# Patient Record
Sex: Female | Born: 1960 | Race: White | Hispanic: No | Marital: Married | Smoking: Never smoker
Health system: Southern US, Community
[De-identification: ages and names within clinical notes are randomized; demographics above are authoritative.]

## PROBLEM LIST (undated history)

## (undated) DIAGNOSIS — K638219 Small intestinal bacterial overgrowth, unspecified: Secondary | ICD-10-CM

## (undated) DIAGNOSIS — Z9109 Other allergy status, other than to drugs and biological substances: Secondary | ICD-10-CM

## (undated) DIAGNOSIS — R011 Cardiac murmur, unspecified: Secondary | ICD-10-CM

## (undated) DIAGNOSIS — M7989 Other specified soft tissue disorders: Secondary | ICD-10-CM

## (undated) DIAGNOSIS — M810 Age-related osteoporosis without current pathological fracture: Secondary | ICD-10-CM

## (undated) DIAGNOSIS — F32A Depression, unspecified: Secondary | ICD-10-CM

## (undated) DIAGNOSIS — K56609 Unspecified intestinal obstruction, unspecified as to partial versus complete obstruction: Secondary | ICD-10-CM

## (undated) DIAGNOSIS — K259 Gastric ulcer, unspecified as acute or chronic, without hemorrhage or perforation: Secondary | ICD-10-CM

## (undated) DIAGNOSIS — K6389 Other specified diseases of intestine: Secondary | ICD-10-CM

## (undated) DIAGNOSIS — G43909 Migraine, unspecified, not intractable, without status migrainosus: Secondary | ICD-10-CM

## (undated) DIAGNOSIS — R12 Heartburn: Secondary | ICD-10-CM

## (undated) DIAGNOSIS — G8929 Other chronic pain: Secondary | ICD-10-CM

## (undated) DIAGNOSIS — J45909 Unspecified asthma, uncomplicated: Secondary | ICD-10-CM

## (undated) DIAGNOSIS — F419 Anxiety disorder, unspecified: Secondary | ICD-10-CM

## (undated) DIAGNOSIS — K760 Fatty (change of) liver, not elsewhere classified: Secondary | ICD-10-CM

## (undated) DIAGNOSIS — I10 Essential (primary) hypertension: Secondary | ICD-10-CM

## (undated) DIAGNOSIS — F329 Major depressive disorder, single episode, unspecified: Secondary | ICD-10-CM

## (undated) DIAGNOSIS — K5900 Constipation, unspecified: Secondary | ICD-10-CM

## (undated) DIAGNOSIS — M5030 Other cervical disc degeneration, unspecified cervical region: Secondary | ICD-10-CM

## (undated) DIAGNOSIS — K219 Gastro-esophageal reflux disease without esophagitis: Secondary | ICD-10-CM

## (undated) DIAGNOSIS — M542 Cervicalgia: Secondary | ICD-10-CM

## (undated) DIAGNOSIS — F4310 Post-traumatic stress disorder, unspecified: Secondary | ICD-10-CM

## (undated) DIAGNOSIS — G4730 Sleep apnea, unspecified: Secondary | ICD-10-CM

## (undated) DIAGNOSIS — R0602 Shortness of breath: Secondary | ICD-10-CM

## (undated) HISTORY — DX: Heartburn: R12

## (undated) HISTORY — PX: MANDIBLE FRACTURE SURGERY: SHX706

## (undated) HISTORY — PX: DIAGNOSTIC LAPAROSCOPY: SUR761

## (undated) HISTORY — DX: Sleep apnea, unspecified: G47.30

## (undated) HISTORY — DX: Cardiac murmur, unspecified: R01.1

## (undated) HISTORY — DX: Other specified soft tissue disorders: M79.89

## (undated) HISTORY — PX: HAND SURGERY: SHX662

## (undated) HISTORY — PX: LUMBAR MICRODISCECTOMY: SHX99

## (undated) HISTORY — DX: Cervicalgia: M54.2

## (undated) HISTORY — DX: Age-related osteoporosis without current pathological fracture: M81.0

## (undated) HISTORY — PX: BACK SURGERY: SHX140

## (undated) HISTORY — DX: Fatty (change of) liver, not elsewhere classified: K76.0

## (undated) HISTORY — DX: Essential (primary) hypertension: I10

## (undated) HISTORY — DX: Anxiety disorder, unspecified: F41.9

## (undated) HISTORY — DX: Unspecified intestinal obstruction, unspecified as to partial versus complete obstruction: K56.609

## (undated) HISTORY — DX: Other chronic pain: G89.29

## (undated) HISTORY — DX: Gastric ulcer, unspecified as acute or chronic, without hemorrhage or perforation: K25.9

## (undated) HISTORY — DX: Small intestinal bacterial overgrowth, unspecified: K63.8219

## (undated) HISTORY — DX: Constipation, unspecified: K59.00

## (undated) HISTORY — DX: Depression, unspecified: F32.A

## (undated) HISTORY — DX: Shortness of breath: R06.02

## (undated) HISTORY — DX: Unspecified asthma, uncomplicated: J45.909

## (undated) HISTORY — DX: Gastro-esophageal reflux disease without esophagitis: K21.9

## (undated) HISTORY — PX: DILATION AND CURETTAGE OF UTERUS: SHX78

## (undated) HISTORY — PX: APPENDECTOMY: SHX54

## (undated) HISTORY — DX: Other allergy status, other than to drugs and biological substances: Z91.09

## (undated) HISTORY — DX: Migraine, unspecified, not intractable, without status migrainosus: G43.909

## (undated) HISTORY — DX: Other specified diseases of intestine: K63.89

## (undated) HISTORY — DX: Other cervical disc degeneration, unspecified cervical region: M50.30

## (undated) HISTORY — DX: Major depressive disorder, single episode, unspecified: F32.9

## (undated) HISTORY — DX: Post-traumatic stress disorder, unspecified: F43.10

---
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
## 2006-03-26 ENCOUNTER — Ambulatory Visit: Payer: Self-pay | Admitting: Family Medicine

## 2006-06-22 ENCOUNTER — Ambulatory Visit: Payer: Self-pay | Admitting: Family Medicine

## 2006-06-25 ENCOUNTER — Ambulatory Visit: Payer: Self-pay | Admitting: Internal Medicine

## 2007-06-09 HISTORY — PX: OTHER SURGICAL HISTORY: SHX169

## 2007-09-06 ENCOUNTER — Emergency Department: Payer: Self-pay | Admitting: Emergency Medicine

## 2007-09-19 ENCOUNTER — Ambulatory Visit: Payer: Self-pay | Admitting: Family Medicine

## 2007-10-05 ENCOUNTER — Ambulatory Visit: Payer: Self-pay | Admitting: Urology

## 2007-11-29 ENCOUNTER — Ambulatory Visit: Payer: Self-pay | Admitting: Internal Medicine

## 2007-12-03 ENCOUNTER — Ambulatory Visit (HOSPITAL_COMMUNITY): Admission: RE | Admit: 2007-12-03 | Discharge: 2007-12-04 | Payer: Self-pay | Admitting: Neurosurgery

## 2008-04-26 ENCOUNTER — Ambulatory Visit: Payer: Self-pay

## 2008-04-27 ENCOUNTER — Ambulatory Visit: Payer: Self-pay

## 2008-05-01 ENCOUNTER — Ambulatory Visit: Payer: Self-pay

## 2008-05-25 ENCOUNTER — Ambulatory Visit: Payer: Self-pay | Admitting: Urology

## 2008-07-27 ENCOUNTER — Ambulatory Visit: Payer: Self-pay | Admitting: Urology

## 2010-01-14 ENCOUNTER — Ambulatory Visit: Payer: Self-pay | Admitting: Internal Medicine

## 2010-07-08 ENCOUNTER — Other Ambulatory Visit: Payer: Self-pay | Admitting: General Practice

## 2010-07-10 ENCOUNTER — Ambulatory Visit: Payer: Self-pay | Admitting: General Practice

## 2010-07-15 ENCOUNTER — Other Ambulatory Visit: Payer: Self-pay | Admitting: Emergency Medicine

## 2010-10-21 NOTE — Op Note (Signed)
NAME:  [name] [name], [name] [name]           ACCOUNT NO.:  [id number]   MEDICAL RECORD NO.:  [id number]          PATIENT TYPE:  OIB   LOCATION:  2550                         FACILITY:  [facility]   PHYSICIAN:  Hewitt Shorts, M.D.DATE OF BIRTH:  04/08/1961   DATE OF PROCEDURE:  12/03/2007  DATE OF DISCHARGE:                               OPERATIVE REPORT   PREOPERATIVE DIAGNOSES:  1. Left L5-S1 lumbar disk herniation.  2. Lumbar degenerative disk disease.  3. Lumbar spondylosis.  4. Lumbar radiculopathy.   POSTOPERATIVE DIAGNOSES:  1. Left L5-S1 lumbar disk herniation.  2. Lumbar degenerative disk disease.  3. Lumbar spondylosis.  4. Lumbar radiculopathy.   PROCEDURES:  Left L5-S1 lumbar laminotomy and microdiskectomy with  microdissection.   SURGEON:  Hewitt Shorts, M.D.   ASSISTANT:  Danae Orleans. Venetia Maxon, M.D.   ANESTHESIA:  General endotracheal.   INDICATIONS:  The patient is a 50 year old woman who presented with  disabling left lumbar radiculopathy who was found to have a large left  L5-S1 lumbar disk herniation with free fragment that has migrated  caudally down to the body of S1 and was wedged in the axilla of the left  S1 nerve root.  Decision was made to proceed with laminotomy and  microdiscectomy.   PROCEDURE:  The patient was brought to the operating room and placed  under general endotracheal anesthesia.  The patient was turned to a  prone position.  Lumbar region was prepped with Betadine soap and  solution and draped in a sterile fashion.  The midline was infiltrated  with local anesthetic with epinephrine and x-ray was taken.  The L5-S1  level was identified and then a midline incision was made over the L5-S1  level and carried down through the subcutaneous tissue.  Bipolar  electrocautery was used to maintain hemostasis.  Dissection was carried  down to the lumbar fascia, which was incised on the left side of the  midline.  The paraspinal muscles were dissected  from the spinous process  and lamina in a subperiosteal fashion.  A self-retaining retractor was  placed and an x-ray was taken and the L5-S1 intralaminar space was  identified.  The microscope was draped and brought to the field to  provide additional navigation, illumination, and visualization.  The  remainder of the decompression was performed using microdissection and  microsurgical technique.  A left L5-S1 laminotomy was performed using  the X-Max drill and Kerrison punches.  The ligamentum flavum was  carefully removed and we identified the thecal sac and left S1 nerve  root.  The S1 laminotomy was carried further caudally and we exposed the  fragment of disk wedged in the axilla of the left S1 nerve root.  This  was gently mobilized and we removed the first portion of fragment.  We  were then able to gently mobilized the S1 nerve root and thecal sac  mediately and a second larger fragment of disk herniation was then  mobilized and removed and we then further examined the epidural space,  we found one last smaller fragment and this was similarly removed.  We  then firmly examined  the epidural space nerve, further fragments were  present.  We examined the annulus and we rent in the annulus through  which these disk herniations that occurred appears to have closed over  and no opening into the annulus was identified.  Therefore, we elected  to not enter into the disk space and instead slowly just removed the  free fragment disk herniation.  Hemostasis was established with the use  of bipolar cautery and Gelfoam soaked with thrombin.  Gelfoam was  removed.  The wound was irrigated with Bacitracin solution.  Hemostasis  was confirmed and then we instilled 2 mL of fentanyl and 80 mg of Depo-  Medrol into the epidural space and then we proceeded with closure.  The  deep fascia was closed with interrupted undyed #1 Vicryl sutures.  Scarpa fascia was closed with interrupted undyed #1 Vicryl  sutures.  The  subcutaneous and subcuticular were closed with interrupted inverted 2-0  undyed Vicryl sutures.  The skin was closed with Dermabond.  The  procedure was tolerated well.  The estimated blood loss was less than 10  mL.  Sponge and needle count were correct.  Following surgery, the  patient was returned back to the supine position, to be reversed from  the anesthetic, extubated, and transferred to the recovery room for  further care.      Hewitt Shorts, M.D.  Electronically Signed     RWN/MEDQ  D:  12/03/2007  T:  12/03/2007  Job:  [id number]

## 2011-03-05 LAB — CBC
HCT: 41.9
Hemoglobin: 14.5
MCHC: 34.5
MCV: 92.7
Platelets: 401 — ABNORMAL HIGH
RBC: 4.52
RDW: 13.1
WBC: 12.2 — ABNORMAL HIGH

## 2011-03-05 LAB — BASIC METABOLIC PANEL
BUN: 11
CO2: 27
Calcium: 9.4
Chloride: 102
Creatinine, Ser: 0.72
GFR calc Af Amer: >60
GFR calc non Af Amer: >60
Glucose, Bld: 135 — ABNORMAL HIGH
Potassium: 4.1
Sodium: 139

## 2011-03-05 LAB — HCG, SERUM, QUALITATIVE: Preg, Serum: NEGATIVE

## 2011-06-09 HISTORY — PX: OTHER SURGICAL HISTORY: SHX169

## 2011-11-03 ENCOUNTER — Other Ambulatory Visit: Payer: Self-pay | Admitting: Podiatry

## 2011-11-05 ENCOUNTER — Encounter (HOSPITAL_BASED_OUTPATIENT_CLINIC_OR_DEPARTMENT_OTHER): Payer: Self-pay | Admitting: *Deleted

## 2011-11-05 NOTE — Progress Notes (Signed)
Pt was told h/p needed md sig-told me she went to a dr Coralie Carpen and called dr Welton Flakes to sign NP h/p They called me back-form not done there-not their NP-she has not been there since 11/12. LM with pt-where did she get this done- Called dr Laray Anger office,she will try to get her. 4pm-pt has not called me back or the office Dr Al Corpus wants to cancel her.

## 2011-11-05 NOTE — Progress Notes (Signed)
Pt had ekg and bmet at [facility] this week-to fax here H/p done by NP Called office to get md to sign  Pt aware

## 2011-11-06 ENCOUNTER — Encounter (HOSPITAL_BASED_OUTPATIENT_CLINIC_OR_DEPARTMENT_OTHER): Admission: RE | Payer: Self-pay | Source: Ambulatory Visit

## 2011-11-06 ENCOUNTER — Ambulatory Visit (HOSPITAL_BASED_OUTPATIENT_CLINIC_OR_DEPARTMENT_OTHER)
Admission: RE | Admit: 2011-11-06 | Payer: Private Health Insurance - Indemnity | Source: Ambulatory Visit | Admitting: Podiatry

## 2011-11-06 ENCOUNTER — Other Ambulatory Visit: Payer: Self-pay | Admitting: Podiatry

## 2011-11-06 HISTORY — DX: Essential (primary) hypertension: I10

## 2011-11-06 SURGERY — BUNIONECTOMY
Anesthesia: General

## 2011-11-11 NOTE — Progress Notes (Signed)
Pt has ov today with dr Welton Flakes for h/p-will fax May need new istat dos-did get labs last week before surg r/s

## 2011-11-13 ENCOUNTER — Ambulatory Visit (HOSPITAL_BASED_OUTPATIENT_CLINIC_OR_DEPARTMENT_OTHER): Payer: Private Health Insurance - Indemnity | Admitting: Anesthesiology

## 2011-11-13 ENCOUNTER — Encounter (HOSPITAL_BASED_OUTPATIENT_CLINIC_OR_DEPARTMENT_OTHER)
Admission: RE | Disposition: A | Discharge status: Home / Self Care | Payer: Self-pay | Source: Ambulatory Visit | Attending: Podiatry

## 2011-11-13 ENCOUNTER — Ambulatory Visit (HOSPITAL_BASED_OUTPATIENT_CLINIC_OR_DEPARTMENT_OTHER)
Admission: RE | Admit: 2011-11-13 | Discharge: 2011-11-13 | Disposition: A | Discharge status: Home / Self Care | Payer: Private Health Insurance - Indemnity | Source: Ambulatory Visit | Attending: Podiatry | Admitting: Podiatry

## 2011-11-13 ENCOUNTER — Encounter (HOSPITAL_BASED_OUTPATIENT_CLINIC_OR_DEPARTMENT_OTHER): Payer: Self-pay | Admitting: Anesthesiology

## 2011-11-13 ENCOUNTER — Encounter (HOSPITAL_BASED_OUTPATIENT_CLINIC_OR_DEPARTMENT_OTHER): Payer: Self-pay | Admitting: Certified Registered Nurse Anesthetist

## 2011-11-13 ENCOUNTER — Encounter (HOSPITAL_BASED_OUTPATIENT_CLINIC_OR_DEPARTMENT_OTHER): Payer: Self-pay | Admitting: Podiatry

## 2011-11-13 DIAGNOSIS — M2190 Unspecified acquired deformity of unspecified limb: Secondary | ICD-10-CM | POA: Insufficient documentation

## 2011-11-13 DIAGNOSIS — J45909 Unspecified asthma, uncomplicated: Secondary | ICD-10-CM | POA: Insufficient documentation

## 2011-11-13 DIAGNOSIS — I10 Essential (primary) hypertension: Secondary | ICD-10-CM | POA: Insufficient documentation

## 2011-11-13 DIAGNOSIS — M2010 Hallux valgus (acquired), unspecified foot: Secondary | ICD-10-CM | POA: Insufficient documentation

## 2011-11-13 DIAGNOSIS — M6240 Contracture of muscle, unspecified site: Secondary | ICD-10-CM | POA: Insufficient documentation

## 2011-11-13 HISTORY — PX: BUNIONECTOMY: SHX129

## 2011-11-13 HISTORY — PX: METATARSAL OSTEOTOMY: SHX1641

## 2011-11-13 SURGERY — BUNIONECTOMY
Anesthesia: General | Site: Foot | Laterality: Right | Wound class: Clean

## 2011-11-13 MED ORDER — ONDANSETRON HCL 4 MG/2ML IJ SOLN
INTRAMUSCULAR | Status: DC | PRN
  Administered 2011-11-13: 4 mg via INTRAVENOUS

## 2011-11-13 MED ORDER — CEPHALEXIN 500 MG PO CAPS: 500.0000 mg | ORAL_CAPSULE | Freq: Three times a day (TID) | ORAL | Status: AC

## 2011-11-13 MED ORDER — PROMETHAZINE HCL 12.5 MG PO TABS: 25.0000 mg | ORAL_TABLET | Freq: Four times a day (QID) | ORAL | Status: DC | PRN

## 2011-11-13 MED ORDER — FENTANYL CITRATE 0.05 MG/ML IJ SOLN
50.0000 ug | INTRAMUSCULAR | Status: DC | PRN
  Administered 2011-11-13: 100 ug via INTRAVENOUS

## 2011-11-13 MED ORDER — DEXAMETHASONE SODIUM PHOSPHATE 10 MG/ML IJ SOLN
INTRAMUSCULAR | Status: DC | PRN
  Administered 2011-11-13: 10 mg via INTRAVENOUS

## 2011-11-13 MED ORDER — SODIUM CHLORIDE 0.9 % IR SOLN
Status: DC | PRN
  Administered 2011-11-13: 15:00:00

## 2011-11-13 MED ORDER — LIDOCAINE HCL 1 % IJ SOLN
INTRAMUSCULAR | Status: DC | PRN
  Administered 2011-11-13: 2 mL via INTRADERMAL

## 2011-11-13 MED ORDER — PROPOFOL 10 MG/ML IV EMUL
INTRAVENOUS | Status: DC | PRN
  Administered 2011-11-13: 220 mg via INTRAVENOUS

## 2011-11-13 MED ORDER — EPHEDRINE SULFATE 50 MG/ML IJ SOLN
INTRAMUSCULAR | Status: DC | PRN
  Administered 2011-11-13 (×4): 10 mg via INTRAVENOUS

## 2011-11-13 MED ORDER — LIDOCAINE HCL 2 % IJ SOLN
INTRAMUSCULAR | Status: DC | PRN
  Administered 2011-11-13: 5 mL

## 2011-11-13 MED ORDER — MEPERIDINE HCL 50 MG PO TABS: ORAL_TABLET | ORAL | Status: AC

## 2011-11-13 MED ORDER — CEFAZOLIN SODIUM 1-5 GM-% IV SOLN: 1.0000 g | INTRAVENOUS | Status: DC

## 2011-11-13 MED ORDER — MIDAZOLAM HCL 2 MG/2ML IJ SOLN
0.5000 mg | INTRAMUSCULAR | Status: DC | PRN
  Administered 2011-11-13: 2 mg via INTRAVENOUS

## 2011-11-13 MED ORDER — MIDAZOLAM HCL 5 MG/5ML IJ SOLN
INTRAMUSCULAR | Status: DC | PRN
  Administered 2011-11-13: 1 mg via INTRAVENOUS

## 2011-11-13 MED ORDER — LIDOCAINE HCL (CARDIAC) 20 MG/ML IV SOLN
INTRAVENOUS | Status: DC | PRN
  Administered 2011-11-13: 30 mg via INTRAVENOUS

## 2011-11-13 MED ORDER — CHLORHEXIDINE GLUCONATE 4 % EX LIQD: 60.0000 mL | Freq: Once | CUTANEOUS | Status: DC

## 2011-11-13 MED ORDER — LACTATED RINGERS IV SOLN
INTRAVENOUS | Status: DC
  Administered 2011-11-13 (×2): via INTRAVENOUS

## 2011-11-13 MED ORDER — LIDOCAINE-EPINEPHRINE 1.5-1:200000 % IJ SOLN
INTRAMUSCULAR | Status: DC | PRN
  Administered 2011-11-13: 25 mL via INTRADERMAL

## 2011-11-13 MED ORDER — CEFAZOLIN SODIUM 1-5 GM-% IV SOLN
1.0000 g | INTRAVENOUS | Status: AC
  Administered 2011-11-13: 1 g via INTRAVENOUS

## 2011-11-13 MED ORDER — ROPIVACAINE HCL 5 MG/ML IJ SOLN
INTRAMUSCULAR | Status: DC | PRN
  Administered 2011-11-13: 25 mL via EPIDURAL

## 2011-11-13 SURGICAL SUPPLY — 66 items
BAG DECANTER FOR FLEXI CONT (MISCELLANEOUS) ×2 IMPLANT
BANDAGE CONFORM 2  STR LF (GAUZE/BANDAGES/DRESSINGS) ×2 IMPLANT
BANDAGE ELASTIC 3 VELCRO ST LF (GAUZE/BANDAGES/DRESSINGS) ×2 IMPLANT
BENZOIN TINCTURE PRP APPL 2/3 (GAUZE/BANDAGES/DRESSINGS) ×2 IMPLANT
BIT DRILL 100X2XQC STRL (BIT) ×1 IMPLANT
BIT DRILL 2.1 (BIT) ×1 IMPLANT
BIT DRILL QC 2.0X100 (BIT) ×1
BIT DRL 100X2XQC STRL (BIT) ×1
BLADE CCA MICRO SAG (BLADE) ×2 IMPLANT
BLADE MINI RND TIP GREEN BEAV (BLADE) IMPLANT
BLADE SURG 15 STRL LF DISP TIS (BLADE) ×2 IMPLANT
BLADE SURG 15 STRL SS (BLADE) ×2
BNDG COHESIVE 1X5 TAN STRL LF (GAUZE/BANDAGES/DRESSINGS) IMPLANT
BNDG COHESIVE 3X5 TAN STRL LF (GAUZE/BANDAGES/DRESSINGS) ×2 IMPLANT
BNDG ESMARK 4X9 LF (GAUZE/BANDAGES/DRESSINGS) ×2 IMPLANT
COVER MAYO STAND STRL (DRAPES) IMPLANT
COVER TABLE BACK 60X90 (DRAPES) ×2 IMPLANT
DECANTER SPIKE VIAL GLASS SM (MISCELLANEOUS) ×4 IMPLANT
DRAPE EXTREMITY T 121X128X90 (DRAPE) ×2 IMPLANT
DRAPE OEC MINIVIEW 54X84 (DRAPES) ×2 IMPLANT
DRAPE U 20/CS (DRAPES) ×2 IMPLANT
DRILL BIT 2.1 (BIT) ×1
DRILL BIT WIRE PASS (BIT) IMPLANT
DRILL WIRE PASS MED (BIT) IMPLANT
DURAPREP 26ML APPLICATOR (WOUND CARE) ×2 IMPLANT
ELECT REM PT RETURN 9FT ADLT (ELECTROSURGICAL) ×2
ELECTRODE REM PT RTRN 9FT ADLT (ELECTROSURGICAL) ×1 IMPLANT
GAUZE SPONGE 4X4 16PLY XRAY LF (GAUZE/BANDAGES/DRESSINGS) ×2 IMPLANT
GAUZE XEROFORM 1X8 LF (GAUZE/BANDAGES/DRESSINGS) ×2 IMPLANT
GLOVE BIO SURGEON STRL SZ 6.5 (GLOVE) ×4 IMPLANT
GLOVE BIO SURGEON STRL SZ7.5 (GLOVE) ×4 IMPLANT
GLOVE BIOGEL PI IND STRL 7.5 (GLOVE) ×1 IMPLANT
GLOVE BIOGEL PI INDICATOR 7.5 (GLOVE) ×1
GLOVE ECLIPSE 6.5 STRL STRAW (GLOVE) ×2 IMPLANT
GLOVE INDICATOR 7.0 STRL GRN (GLOVE) ×2 IMPLANT
GOWN PREVENTION PLUS XLARGE (GOWN DISPOSABLE) ×8 IMPLANT
GOWN PREVENTION PLUS XXLARGE (GOWN DISPOSABLE) IMPLANT
K-WIRE 1.2X100 (WIRE) ×2
KWIRE 1.2X100 (WIRE) ×1 IMPLANT
KWIRE 4.0 X .045IN (WIRE) IMPLANT
NDL SAFETY ECLIPSE 18X1.5 (NEEDLE) ×1 IMPLANT
NEEDLE HYPO 18GX1.5 SHARP (NEEDLE) ×1
NEEDLE HYPO 25X1 1.5 SAFETY (NEEDLE) ×4 IMPLANT
PACK BASIN DAY SURGERY FS (CUSTOM PROCEDURE TRAY) ×2 IMPLANT
PADDING CAST ABS 4INX4YD NS (CAST SUPPLIES) ×1
PADDING CAST ABS COTTON 4X4 ST (CAST SUPPLIES) ×1 IMPLANT
PENCIL BUTTON HOLSTER BLD 10FT (ELECTRODE) ×2 IMPLANT
SCREW CANN 3.0X16MM (Screw) ×2 IMPLANT
SCREW CORTEX ST 2.0X12 (Screw) ×2 IMPLANT
SPONGE GAUZE 2X2 8PLY STRL LF (GAUZE/BANDAGES/DRESSINGS) ×4 IMPLANT
SPONGE GAUZE 4X4 12PLY (GAUZE/BANDAGES/DRESSINGS) ×2 IMPLANT
STOCKINETTE 4X48 STRL (DRAPES) ×2 IMPLANT
STOCKINETTE TUBULAR COTT 4X25 (GAUZE/BANDAGES/DRESSINGS) ×4 IMPLANT
SUT MNCRL AB 4-0 PS2 18 (SUTURE) ×2 IMPLANT
SUT MON AB 3-0 SH 27 (SUTURE) ×1
SUT MON AB 3-0 SH27 (SUTURE) ×1 IMPLANT
SUT MON AB 5-0 P3 18 (SUTURE) ×2 IMPLANT
SUT MON AB 5-0 PS2 18 (SUTURE) ×2 IMPLANT
SUT PROLENE 4 0 P 3 18 (SUTURE) ×2 IMPLANT
SUT STEEL 2 0 (SUTURE) IMPLANT
SUT STRIPS 1/4 X 4 INCH (SUTURE) IMPLANT
SYR 3ML 18GX1 1/2 (SYRINGE) ×2 IMPLANT
SYR BULB 3OZ (MISCELLANEOUS) ×2 IMPLANT
SYR CONTROL 10ML LL (SYRINGE) ×2 IMPLANT
UNDERPAD 30X30 INCONTINENT (UNDERPADS AND DIAPERS) ×2 IMPLANT
WATER STERILE IRR 1000ML POUR (IV SOLUTION) ×2 IMPLANT

## 2011-11-13 NOTE — Brief Op Note (Signed)
11/13/2011  3:55 PM  PODIATRY POST OPERATIVE NOTE  SURGEON: [place], Stanford Strauch Todd  ASSISTANT: Marlowe Aschoff  PRE-OP DIAGNOSIS: Hallux Valgus Rt., Plantarflexed second metatarsal right, hammer toe deformity second and third toes right foot.  POST-OP DIAGNOSIS: same  PROCEDURE: Austin-Youngswick osteotomy with screw right, second metatarsal osteotomy with screw right, digital flexor tendon release second and third toes right.  ANESTHESIA: General with local and popliteal block   HEMOSTASIS: Tourniquet   ESTIMATED BLOOD LOSS: minimal   MATERIALS: 3.0 stryker 15mm; 2.0 synthese 12mm  INJECTABLES: 10ml 2% lidocaine without epinephrine  COMPLICATIONS: none  PATHOLOGY: none  CONDITION: vital signs stable   [name] [name] 11/13/2011,3:56 PM

## 2011-11-13 NOTE — Progress Notes (Signed)
Assisted Dr. Gelene Mink with right, popliteal/saphenous block. Side rails up, monitors on throughout procedure. See vital signs in flow sheet. Tolerated Procedure well.

## 2011-11-13 NOTE — Discharge Instructions (Signed)
Post Anesthesia Home Care Instructions  Activity: Get plenty of rest for the remainder of the day. A responsible adult should stay with you for 24 hours following the procedure.  For the next 24 hours, DO NOT: -Drive a car -Advertising copywriter -Drink alcoholic beverages -Take any medication unless instructed by your physician -Make any legal decisions or sign important papers.  Meals: Start with liquid foods such as gelatin or soup. Progress to regular foods as tolerated. Avoid greasy, spicy, heavy foods. If nausea and/or vomiting occur, drink only clear liquids until the nausea and/or vomiting subsides. Call your physician if vomiting continues.  Special Instructions/Symptoms: Your throat may feel dry or sore from the anesthesia or the breathing tube placed in your throat during surgery. If this causes discomfort, gargle with warm salt water. The discomfort should disappear within 24 hours.     Regional Anesthesia Blocks  1. Numbness or the inability to move the "blocked" extremity may last from 3-48 hours after placement. The length of time depends on the medication injected and your individual response to the medication. If the numbness is not going away after 48 hours, call your surgeon.  2. The extremity that is blocked will need to be protected until the numbness is gone and the  Strength has returned. Because you cannot feel it, you will need to take extra care to avoid injury. Because it may be weak, you may have difficulty moving it or using it. You may not know what position it is in without looking at it while the block is in effect.  3. For blocks in the legs and feet, returning to weight bearing and walking needs to be done carefully. You will need to wait until the numbness is entirely gone and the strength has returned. You should be able to move your leg and foot normally before you try and bear weight or walk. You will need someone to be with you when you first try to  ensure you do not fall and possibly risk injury.  4. Bruising and tenderness at the needle site are common side effects and will resolve in a few days.  5. Persistent numbness or new problems with movement should be communicated to the surgeon or the [facility] [phone] [facility] [phone]).     1. If you are recuperating from surgery anywhere other than home, please be sure to leave [place] the number where you can be reached.  2. Go directly home and rest.  3. Keep the operated foot (or feet) elevated six inches above the hip when sitting or lying.  4. Support the elevated foot and leg with pillows. DO NOT PLACE PILLOWS UNDER THE KNEE.  5. DO NOT REMOVE or get your bandages WET, you run a higher risk of getting an infection.  6. Wear your surgical shoe at all times when you are up.  7. A limited amount of pain swelling may occur. The skin may take on a bruised appearance. This is no cause for alarm.  8.For slight pain and swelling, apply an ice pack directly over the bandage for 15 minutes only out of each hour. Continue until seen in the office. DO NOT apply any form of heat to the area.  9. Have prescription(s) filled immediately and take as directed.  10. Drink a lot of liquids, water and juice.  11. CALL THE OFFICE IMMEDIATELY IF A.Bleeding continues B.Pain increases and/or does not respond to medication C.Bandage or cast appears  too tight D. Any liquids (water, coffee, etc.) spilling on your bandages E. Tripping, falling or stubbing the surgical foot F. If your temperature goes above 101 G. If you have ANY questions at all  12. Please use the crutches or walker that you rented. Do NO put weight on the operated foot for 0 days  13. Special instructions: 0  14. Your next appointment is: Thursday November 19, 2011 at 2:45  YOU NOW CONTROL THE EFFORT OF YOUR RECOVERY: Adhering to these rules will offer the most complete results.

## 2011-11-13 NOTE — Anesthesia Preprocedure Evaluation (Signed)
Anesthesia Evaluation  Patient identified by MRN, date of birth, ID band Patient awake    Reviewed: Allergy & Precautions, H&P , NPO status , Patient's Chart, lab work & pertinent test results, reviewed documented beta blocker date and time   Airway Mallampati: II TM Distance: >3 FB Neck ROM: full    Dental   Pulmonary asthma ,          Cardiovascular hypertension, On Medications and On Home Beta Blockers     Neuro/Psych negative neurological ROS  negative psych ROS   GI/Hepatic negative GI ROS, Neg liver ROS,   Endo/Other  negative endocrine ROS  Renal/GU negative Renal ROS  negative genitourinary   Musculoskeletal   Abdominal   Peds  Hematology negative hematology ROS (+)   Anesthesia Other Findings See surgeon's H&P   Reproductive/Obstetrics negative OB ROS                           Anesthesia Physical Anesthesia Plan  ASA: II  Anesthesia Plan: General   Post-op Pain Management:    Induction: Intravenous  Airway Management Planned: LMA  Additional Equipment:   Intra-op Plan:   Post-operative Plan: Extubation in OR  Informed Consent: I have reviewed the patients History and Physical, chart, labs and discussed the procedure including the risks, benefits and alternatives for the proposed anesthesia with the patient or authorized representative who has indicated his/her understanding and acceptance.   Dental Advisory Given  Plan Discussed with: CRNA and Surgeon  Anesthesia Plan Comments:         Anesthesia Quick Evaluation

## 2011-11-13 NOTE — Transfer of Care (Signed)
Immediate Anesthesia Transfer of Care Note  Patient: [name] [name]  Procedure(s) Performed: Procedure(s) (LRB): BUNIONECTOMY (Right) METATARSAL OSTEOTOMY (Right)  Patient Location: PACU  Anesthesia Type: General and Regional  Level of Consciousness: awake, alert , oriented and patient cooperative  Airway & Oxygen Therapy: Patient Spontanous Breathing and Patient connected to face mask oxygen  Post-op Assessment: Report given to PACU RN and Post -op Vital signs reviewed and stable  Post vital signs: Reviewed and stable  Complications: No apparent anesthesia complications

## 2011-11-13 NOTE — Anesthesia Procedure Notes (Addendum)
Anesthesia Regional Block:  Popliteal block  Pre-Anesthetic Checklist: ,, timeout performed, Correct Patient, Correct Site, Correct Laterality, Correct Procedure, Correct Position, site marked, Risks and benefits discussed,  Surgical consent,  Pre-op evaluation,  At surgeon's request and post-op pain management  Laterality: Right  Prep: chloraprep       Needles:   Needle Type: Other   (Arrow Echogenic)   Needle Length: 9cm  Needle Gauge: 21    Additional Needles:  Procedures: ultrasound guided Popliteal block Narrative:  Start time: 11/13/2011 1:32 PM End time: 11/13/2011 1:41 PM Injection made incrementally with aspirations every 5 mL.  Performed by: Personally  Anesthesiologist: Aldona Lento, MD  Additional Notes: Ultrasound guidance used to: id relevant anatomy, confirm needle position, local anesthetic spread, avoidance of vascular puncture. Picture saved. No complications. Block performed personally by Janetta Hora. Gelene Mink, MD  .    Popliteal block Procedure Name: LMA Insertion Date/Time: 11/13/2011 2:43 PM Performed by: Zuri Bradway D Pre-anesthesia Checklist: Patient identified, Emergency Drugs available, Suction available and Patient being monitored Patient Re-evaluated:Patient Re-evaluated prior to inductionOxygen Delivery Method: Circle System Utilized Preoxygenation: Pre-oxygenation with 100% oxygen Intubation Type: IV induction Ventilation: Mask ventilation without difficulty LMA: LMA inserted LMA Size: 4.0 Number of attempts: 1 Placement Confirmation: positive ETCO2 Tube secured with: Tape Dental Injury: Teeth and Oropharynx as per pre-operative assessment

## 2011-11-13 NOTE — H&P (Signed)
  H&P reviewed today and placed in chart.

## 2011-11-14 NOTE — Anesthesia Postprocedure Evaluation (Signed)
Anesthesia Post Note  Patient: [name] [name]  Procedure(s) Performed: Procedure(s) (LRB): BUNIONECTOMY (Right) METATARSAL OSTEOTOMY (Right)  Anesthesia type: General  Patient location: PACU  Post pain: Pain level controlled  Post assessment: Patient's Cardiovascular Status Stable  Last Vitals:  Filed Vitals:   11/13/11 1700  BP: 139/78  Pulse:   Temp: 36.5 C  Resp: 20    Post vital signs: Reviewed and stable  Level of consciousness: alert  Complications: No apparent anesthesia complications

## 2011-11-16 NOTE — Op Note (Signed)
NAME:  [name] [name]           ACCOUNT NO.:  [id number]  MEDICAL RECORD NO.:  [id number]  LOCATION:                                 FACILITY:  PHYSICIAN:  Sundra Haddix T. Epimenio Schetter, D.P.M.        DATE OF BIRTH:  DATE OF PROCEDURE: DATE OF DISCHARGE:                              OPERATIVE REPORT   PREOPERATIVE DIAGNOSES: 1. Hallux abductovalgus deformity, right foot. 2. Plantarflexed second metatarsal, right foot. 3. Contracted flexor tender, second toe, right foot. 4. Contracted flexor tender, third toe, right foot.  POSTOPERATIVE DIAGNOSES: 1. Hallux abductovalgus deformity, right foot. 2. Plantarflexed second metatarsal, right foot. 3. Contracted flexor tender, second toe, right foot. 4. Contracted flexor tender, third toe, right foot.  PROCEDURES: 1. Austin-Youngswick osteotomy with a 3.0 x 15-mm cannulated Stryker     screw. 2. Second metatarsal capital osteotomy shortening in nature with a 2.0     x 12-mm Synthes screw. 3. Flexor tendon tenotomy at the PIPJ, second digit, right foot. 4. Flexor tendon tenotomy at the PIPJ, third digit, right foot.  SURGEON:  Feige Lowdermilk T. [place], [place]  FIRST ASSISTANT:  Marlowe Aschoff, DPM  DESCRIPTION FOR PROCEDURE:  [name] [name] was given a popliteal block to her right lower extremity in preop holding.  She was then taken to the operating room, vital signs were stable, alert and oriented x3, and placed supine on the operating room table.  After general anesthesia was induced, pneumatic calf tourniquet was placed about the right lower extremity after cleaning of Webril pad and contusion.  The right lower extremity was then prepped and draped in a normal sterile fashion.  Austin-Youngswick osteotomy with a 3.0 x 15-mm cannulated Stryker screw: An Esmarch bandage was utilized to exsanguinate the blood from the right lower extremity.  The pneumatic calf tourniquet was inflated to 250 mmHg.  Dorsal linear incision was made over the first  metatarsal phalangeal joint medial to the extensor hallucis longus tendon.  It was carried deep with combination with sharp and blunt dissection to the level of the first metatarsal phalangeal joint.  All neurovascular structures were carefully retracted.  All bleeders were bovied and were ligated.  Once to the level of the first metatarsal phalangeal joint, attention was directed to the lateral aspect where the adductor tendon was identified and released from its insertion of the fibular sesamoid and the proximal phalanx.  Portion of this tendon was resected and removed.  The belly of the adductor muscle was bovied.  We did release the fibular sesamoid and flapped down.  This area was copiously lavaged with normal sterile saline.  Attention was directed to the first metatarsal phalangeal joint and the periosteum, the periosteum was incised linearly and an L-capsulotomy was performed.  The periosteum was reflected with the [employer] elevator and the capsule was reflected sharply to reveal the head of the first metatarsal through the wound.  Utilizing a sagittal saw, we resected the hypertropic medial condyle to the head of that first metatarsal, right foot and then in the typical fashion, performed a Austin-Youngswick-type osteotomy with apex centrally located in the head of the metatarsal.  Once this was performed, we had a nice shortening and then we  translocated the head laterally.  Once the head was in good position, we aligned the sesamoid.  Once the sesamoid was aligned, we then placed a K-wire across the osteotomy site.  This K-wire was used to come from [employer] and was used to guide the 3.0 x 15-mm Stryker screw.  The screw was placed in typical lag fashion and was noted to be tight.  We resected the medial shelf and then recontoured the bone to a normal anatomical resembling making sure that we had good alignment.  We XiScaned the osteotomy.  The screw was in perfect position and  the osteotomy was complete.  The area was copiously lavaged with normal sterile saline once again.  Periosteal and capsular closure was made with a 3-0 Monocryl, 4-0 Monocryl for subcutaneous closure, 5-0 Monocryl in a subcuticular fashion for closure.  Attention was then directed to the second metatarsal phalangeal joint.  Second metatarsal capital osteotomy shortening in nature with a 2.0 x 12- mm Synthes screw:  Using a skin blade, a linear incision approximately 2.5 cm long was made over the second metatarsal phalangeal joint.  This incision was then carried deep with combination with sharp and blunt dissection.  All neurovascular structures were carefully retracted.  All bleeders were bovied and were ligated.  Once to the level of the extensor hood, I then incised the periosteum and the capsule reflecting the tendons laterally and bring the periosteum from its adhesion to the bone.  I then reflected the head of the second metatarsal through the wound.  At this point, we performed a Weil osteotomy from dorsal, distal to proximal plantar through and through with a sagittal saw.  This allowed for a translocation of the head of the second metatarsal proximally.  Using the Hopkins, we made sure that a parabola was normal. Once the capital fragment was fully owned, we then placed a 2.0 x 12-mm Synthes screw in typical lag fashion across the osteotomy site. Resecting the dorsal self of bone with a rongeur, I then recontoured it to a normal anatomical resembling with an oscillating rasp.  The area was copiously lavaged with normal sterile saline.  Closure was made with a 4-0 Monocryl for deep structures and 5-0 Monocryl for subcutaneous closure.  Attention was then directed to the plantar aspect of the PIPJ the second and third digit.  Flexor tendon tenotomy at the PIPJ, second digit, right foot:  A small transverse incision was made beneath the PIPJ (proximal intermetatarsal).  This  transverse incision was then taken deep to the level of the joint as well as the flexor tendon.  The toe was straightened pulling the tendon against the blade, this allowed for feathering of the tendon and allowed [place] to straighten the toe perfectly. The area was copiously lavaged.  I then closed it was a single simple suture, which was 4-0 Monocryl.  Flexor tendon tenotomy at the PIPJ, third digit, right foot:  A small transverse incision was made beneath the PIPJ (proximal intermetatarsal).  This transverse incision was then taken deep to the level of the joint as well as the flexor tendon.  The toe was straightened pulling the tendon against the blade, this allowed for feathering of the tendon and allowed [place] to straighten the toe perfectly. The area was copiously lavaged.  I then closed it was a single simple suture, which was 4-0 Monocryl.  The Xeroform gauze, Betadine solution, and dry sterile compressive dressing was applied to the all of the incision sites and  the pneumatic calf tourniquet to the right lower extremity was deflated.  Capillary refill time digits 1 through 5 of the left foot was noted to be immediate.  The patient left the operating room.  Vital signs were stable.  Alert and oriented x3 for PACU.  Her vital signs will be monitored for a period of time before being released to family members. Both oral and written home-going instructions as well as prescription for postop pain medication was dispensed.  A CAM walker was placed to the right leg in PACU.  I will follow up with her in the [place] office in 1 week.  Should she have questions or concerns, she will notify us immediately or notify the doctor on-call with emergency beeper system.          ______________________________ Annye Rusk. Al Corpus, D.P.M.     MTH/MEDQ  D:  11/13/2011  T:  11/14/2011  Job:  [id number]

## 2011-11-18 ENCOUNTER — Encounter (HOSPITAL_BASED_OUTPATIENT_CLINIC_OR_DEPARTMENT_OTHER): Payer: Self-pay | Admitting: Podiatry

## 2011-11-19 ENCOUNTER — Encounter (HOSPITAL_BASED_OUTPATIENT_CLINIC_OR_DEPARTMENT_OTHER): Payer: Self-pay

## 2012-03-24 ENCOUNTER — Ambulatory Visit: Payer: Self-pay | Admitting: Physician Assistant

## 2012-04-02 ENCOUNTER — Ambulatory Visit: Payer: Self-pay | Admitting: Internal Medicine

## 2012-04-13 ENCOUNTER — Other Ambulatory Visit: Payer: Self-pay | Admitting: Neurosurgery

## 2012-04-13 DIAGNOSIS — M542 Cervicalgia: Secondary | ICD-10-CM

## 2012-04-15 ENCOUNTER — Ambulatory Visit
Admission: RE | Admit: 2012-04-15 | Discharge: 2012-04-15 | Disposition: A | Discharge status: Home / Self Care | Payer: Private Health Insurance - Indemnity | Source: Ambulatory Visit | Attending: Neurosurgery | Admitting: Neurosurgery

## 2012-04-15 VITALS — BP 110/66 | HR 73

## 2012-04-15 DIAGNOSIS — M542 Cervicalgia: Secondary | ICD-10-CM

## 2012-04-15 MED ORDER — DIAZEPAM 5 MG PO TABS
10.0000 mg | ORAL_TABLET | Freq: Once | ORAL | Status: AC
  Administered 2012-04-15: 10 mg via ORAL

## 2012-04-15 MED ORDER — IOHEXOL 300 MG/ML  SOLN
9.0000 mL | Freq: Once | INTRAMUSCULAR | Status: AC | PRN
  Administered 2012-04-15: 9 mL via INTRATHECAL

## 2013-04-19 ENCOUNTER — Ambulatory Visit (INDEPENDENT_AMBULATORY_CARE_PROVIDER_SITE_OTHER): Payer: Private Health Insurance - Indemnity

## 2013-04-19 ENCOUNTER — Encounter: Payer: Self-pay | Admitting: Podiatry

## 2013-04-19 ENCOUNTER — Ambulatory Visit (INDEPENDENT_AMBULATORY_CARE_PROVIDER_SITE_OTHER): Payer: Private Health Insurance - Indemnity | Admitting: Podiatry

## 2013-04-19 VITALS — BP 120/72 | HR 64 | Resp 16 | Ht 60.0 in | Wt 170.0 lb

## 2013-04-19 DIAGNOSIS — M21621 Bunionette of right foot: Secondary | ICD-10-CM

## 2013-04-19 DIAGNOSIS — M21619 Bunion of unspecified foot: Secondary | ICD-10-CM

## 2013-04-19 DIAGNOSIS — M7750 Other enthesopathy of unspecified foot: Secondary | ICD-10-CM

## 2013-04-19 DIAGNOSIS — M79609 Pain in unspecified limb: Secondary | ICD-10-CM

## 2013-04-19 DIAGNOSIS — M79671 Pain in right foot: Secondary | ICD-10-CM

## 2013-04-19 DIAGNOSIS — M778 Other enthesopathies, not elsewhere classified: Secondary | ICD-10-CM

## 2013-04-19 DIAGNOSIS — M2010 Hallux valgus (acquired), unspecified foot: Secondary | ICD-10-CM

## 2013-04-19 NOTE — Progress Notes (Signed)
[name] [name] presents today as a 52 year old white female with a chief complaint of a painful fifth metatarsal of the right foot. She states that of noticed this bump sitting appear further further it is rubbing in my shoes. She's also concerned about a bunion deformity and hammertoes to the left foot.  Objective: Vital signs are stable she is alert and oriented x3 I have reviewed her past medical history medications and allergies. She has a palpable mass fifth metatarsal head of the right foot with overlying bursitis beneath the fifth metatarsal head. Radiographs confirm this. Contralateral foot does demonstrate hallux abductovalgus the minimal at this point mildly tender on palpation as well as range of motion. Hammertoe deformities #3 and #4 of the left foot. Mild Taylor's bunion deformity.  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  Plan: Injection of dexamethasone and local anesthetic today to the fifth metatarsal bursa right foot. We'll followup with her in the near future for surgical intervention on the contralateral foot.

## 2013-05-22 ENCOUNTER — Ambulatory Visit: Payer: Self-pay | Admitting: Specialist

## 2013-05-22 LAB — CBC WITH DIFFERENTIAL/PLATELET
Basophil %: 0.5 %
Eosinophil #: 0.1 10*3/uL (ref 0.0–0.7)
Eosinophil %: 1.2 %
Lymphocyte #: 2 10*3/uL (ref 1.0–3.6)
MCHC: 33 g/dL (ref 32.0–36.0)
MCV: 90 fL (ref 80–100)
Neutrophil %: 67.2 %
RDW: 13 % (ref 11.5–14.5)
WBC: 7.8 10*3/uL (ref 3.6–11.0)

## 2013-05-22 LAB — PROTIME-INR: INR: 0.9

## 2013-05-22 LAB — COMPREHENSIVE METABOLIC PANEL
BUN: 12 mg/dL (ref 7–18)
Bilirubin,Total: 0.3 mg/dL (ref 0.2–1.0)
Creatinine: 0.81 mg/dL (ref 0.60–1.30)
Osmolality: 274 (ref 275–301)
Potassium: 3.5 mmol/L (ref 3.5–5.1)
SGOT(AST): 47 U/L — ABNORMAL HIGH (ref 15–37)
SGPT (ALT): 102 U/L — ABNORMAL HIGH (ref 12–78)

## 2013-05-22 LAB — IRON AND TIBC
Iron Bind.Cap.(Total): 393 ug/dL (ref 250–450)
Iron Saturation: 18 %

## 2013-05-22 LAB — HEPATIC FUNCTION PANEL A (ARMC): Bilirubin, Direct: <0.1 mg/dL (ref 0.00–0.20)

## 2013-05-22 LAB — AMYLASE: Amylase: 19 U/L — ABNORMAL LOW (ref 25–115)

## 2013-05-29 ENCOUNTER — Ambulatory Visit: Payer: Self-pay | Admitting: Specialist

## 2013-06-08 ENCOUNTER — Ambulatory Visit: Payer: Self-pay | Admitting: Specialist

## 2013-06-08 HISTORY — PX: LAPAROSCOPIC GASTRIC SLEEVE RESECTION: SHX5895

## 2013-06-19 ENCOUNTER — Ambulatory Visit: Payer: Self-pay | Admitting: Gastroenterology

## 2015-06-19 ENCOUNTER — Encounter: Payer: Self-pay | Admitting: Physician Assistant

## 2015-06-19 ENCOUNTER — Ambulatory Visit: Payer: Self-pay | Admitting: Physician Assistant

## 2015-06-19 VITALS — BP 138/90 | HR 60 | Temp 97.6°F

## 2015-06-19 DIAGNOSIS — M549 Dorsalgia, unspecified: Secondary | ICD-10-CM

## 2015-06-19 MED ORDER — PREDNISONE 10 MG (48) PO TBPK: ORAL_TABLET | Freq: Every day | ORAL | Status: DC

## 2015-06-19 MED ORDER — HYDROCODONE-ACETAMINOPHEN 5-325 MG PO TABS: 1.0000 | ORAL_TABLET | Freq: Four times a day (QID) | ORAL | Status: DC | PRN

## 2015-06-19 NOTE — Progress Notes (Signed)
S: c/o near fall yesterday, slipped in parking lot prior to going to work at [employer], said there was cardboard in lot and probably had ice underneath it which made her slip, did not fall, says she strained her back when this happened and has been having a lot of muscle spasms since, tried her regular meds for her back, flexeril and tramadol but not helping, denies numbness or tingling in arms/legs, able to walk and bw without difficulty  O: vitals wnl, nad, spine is nontender, + spasms across lower back b/l, n/v intact, able to walk without difficulty  A: low back pain secondary to near fall  P: vicodin 5/325 #20 nr ; sterapred ds 10mg  12d dose pack, ice , wet heat, if not better in 1 week can refer to PT

## 2015-08-09 ENCOUNTER — Other Ambulatory Visit: Payer: Self-pay | Admitting: Neurosurgery

## 2015-08-09 DIAGNOSIS — S335XXA Sprain of ligaments of lumbar spine, initial encounter: Secondary | ICD-10-CM

## 2015-08-21 ENCOUNTER — Ambulatory Visit (HOSPITAL_COMMUNITY)
Admission: RE | Admit: 2015-08-21 | Discharge: 2015-08-21 | Disposition: A | Discharge status: Home / Self Care | Payer: PRIVATE HEALTH INSURANCE | Source: Ambulatory Visit | Attending: Neurosurgery | Admitting: Neurosurgery

## 2015-08-21 DIAGNOSIS — S335XXA Sprain of ligaments of lumbar spine, initial encounter: Secondary | ICD-10-CM

## 2015-08-21 DIAGNOSIS — M4726 Other spondylosis with radiculopathy, lumbar region: Secondary | ICD-10-CM | POA: Diagnosis present

## 2015-08-21 DIAGNOSIS — M5116 Intervertebral disc disorders with radiculopathy, lumbar region: Secondary | ICD-10-CM | POA: Diagnosis not present

## 2015-08-21 DIAGNOSIS — M2578 Osteophyte, vertebrae: Secondary | ICD-10-CM | POA: Diagnosis not present

## 2015-08-21 DIAGNOSIS — Z9889 Other specified postprocedural states: Secondary | ICD-10-CM | POA: Insufficient documentation

## 2015-08-21 MED ORDER — GADOBENATE DIMEGLUMINE 529 MG/ML IV SOLN
20.0000 mL | Freq: Once | INTRAVENOUS | Status: AC | PRN
  Administered 2015-08-21: 16 mL via INTRAVENOUS

## 2016-02-03 ENCOUNTER — Ambulatory Visit: Payer: PRIVATE HEALTH INSURANCE | Admitting: Physician Assistant

## 2016-02-03 ENCOUNTER — Encounter: Payer: Self-pay | Admitting: Physician Assistant

## 2016-02-03 VITALS — BP 122/70 | HR 67 | Temp 98.0°F

## 2016-02-03 DIAGNOSIS — J069 Acute upper respiratory infection, unspecified: Secondary | ICD-10-CM

## 2016-02-03 DIAGNOSIS — M549 Dorsalgia, unspecified: Secondary | ICD-10-CM

## 2016-02-03 MED ORDER — PREDNISONE 10 MG (48) PO TBPK: ORAL_TABLET | Freq: Every day | ORAL | 0 refills | Status: DC

## 2016-02-03 MED ORDER — CEFDINIR 300 MG PO CAPS: 300.0000 mg | ORAL_CAPSULE | Freq: Two times a day (BID) | ORAL | 0 refills | Status: DC

## 2016-02-03 MED ORDER — FLUCONAZOLE 150 MG PO TABS: ORAL_TABLET | ORAL | 0 refills | Status: DC

## 2016-02-03 NOTE — Progress Notes (Signed)
S: C/o runny nose and congestion for 3 days, no fever, chills, cp/sob, v/d; mucus was green this am , cough is sporadic, some wheezing, using inhalers and otc meds  Using otc meds: robitussin  O: PE: vitals wnl, nad, perrl eomi, normocephalic, tms dull, nasal mucosa red and swollen, throat injected, neck supple no lymph, lungs c t a, cv rrr, neuro intact  A:  Acute uri   P: drink fluids, continue regular meds , use otc meds of choice, return if not improving in 5 days, return earlier if worsening, omnicef 300mg  bid, sterapred ds 10mg  12 d dose pack, diflucan if needed

## 2016-03-30 ENCOUNTER — Other Ambulatory Visit: Payer: Self-pay | Admitting: Physician Assistant

## 2016-03-30 DIAGNOSIS — G8929 Other chronic pain: Secondary | ICD-10-CM

## 2016-03-30 DIAGNOSIS — M549 Dorsalgia, unspecified: Principal | ICD-10-CM

## 2016-04-10 ENCOUNTER — Ambulatory Visit: Payer: Managed Care, Other (non HMO)

## 2016-06-08 HISTORY — PX: OTHER SURGICAL HISTORY: SHX169

## 2017-01-20 DIAGNOSIS — Z903 Acquired absence of stomach [part of]: Secondary | ICD-10-CM | POA: Insufficient documentation

## 2017-01-20 DIAGNOSIS — Z9884 Bariatric surgery status: Secondary | ICD-10-CM | POA: Insufficient documentation

## 2017-01-20 DIAGNOSIS — K565 Intestinal adhesions [bands], unspecified as to partial versus complete obstruction: Secondary | ICD-10-CM | POA: Insufficient documentation

## 2017-01-25 ENCOUNTER — Other Ambulatory Visit: Payer: Self-pay | Admitting: Physician Assistant

## 2017-01-25 MED ORDER — CIPROFLOXACIN HCL 250 MG PO TABS: 250.0000 mg | ORAL_TABLET | Freq: Two times a day (BID) | ORAL | 0 refills | Status: DC

## 2017-01-25 NOTE — Progress Notes (Unsigned)
Pt called and has uti, tested her urine and has wbc, called in cipro to [facility]

## 2017-02-09 ENCOUNTER — Other Ambulatory Visit: Payer: Self-pay | Admitting: Nurse Practitioner

## 2017-02-09 DIAGNOSIS — K56609 Unspecified intestinal obstruction, unspecified as to partial versus complete obstruction: Secondary | ICD-10-CM

## 2017-02-17 ENCOUNTER — Ambulatory Visit
Admission: RE | Admit: 2017-02-17 | Discharge: 2017-02-17 | Disposition: A | Discharge status: Home / Self Care | Payer: 59 | Source: Ambulatory Visit | Attending: Nurse Practitioner | Admitting: Nurse Practitioner

## 2017-04-01 ENCOUNTER — Other Ambulatory Visit: Payer: Self-pay | Admitting: Obstetrics and Gynecology

## 2017-04-28 ENCOUNTER — Other Ambulatory Visit: Payer: Self-pay | Admitting: Obstetrics and Gynecology

## 2017-05-12 ENCOUNTER — Ambulatory Visit: Payer: Self-pay | Admitting: Physician Assistant

## 2017-05-12 ENCOUNTER — Encounter: Payer: Self-pay | Admitting: Physician Assistant

## 2017-05-12 VITALS — BP 122/100 | HR 60 | Temp 97.8°F

## 2017-05-12 DIAGNOSIS — J0120 Acute ethmoidal sinusitis, unspecified: Secondary | ICD-10-CM

## 2017-05-12 MED ORDER — BENZONATATE 200 MG PO CAPS: 200.0000 mg | ORAL_CAPSULE | Freq: Three times a day (TID) | ORAL | 0 refills | Status: DC | PRN

## 2017-05-12 MED ORDER — AMOXICILLIN-POT CLAVULANATE 875-125 MG PO TABS: 1.0000 | ORAL_TABLET | Freq: Two times a day (BID) | ORAL | 0 refills | Status: DC

## 2017-05-12 NOTE — Progress Notes (Signed)
   Subjective: Sinusitis     Patient ID: [name] [name], female    DOB: 02-23-1961, 56 y.o.   MRN: [id number]  HPI Patient complaining of sinus congestion, facial pressure, bilateral ear pain, and sore throat for 5 days. Patient denies fever/chills, nausea, vomiting, or diarrhea. Patient stated mild transient relief over-the-counter cough preparations.   Review of Systems Negative except for complaint    Objective:   Physical Exam HEENT is remarkable for bilateral edematous nasal turbinates with thick yellowish rhinorrhea. Patient moderate maxillary guarding. Patient has bilateral edematous TMs. Throat is mildly erythematous with postnasal drainage. Neck is supple full accident. Lungs clear to auscultation heart regular rate and rhythm.       Assessment & Plan: Sinusitis   Patient given discharge Instructions. Patient given a prescription for Augmentin and Tessalon Perles. Patient advised follow-up PCP if no improvement within 5-7 days.

## 2017-06-21 ENCOUNTER — Other Ambulatory Visit: Payer: Self-pay

## 2017-06-21 MED ORDER — PROMETHAZINE HCL 12.5 MG PO TABS: 25.0000 mg | ORAL_TABLET | Freq: Four times a day (QID) | ORAL | 0 refills | Status: DC | PRN

## 2017-06-21 MED ORDER — PROMETHAZINE HCL 12.5 MG PO TABS: ORAL_TABLET | ORAL | 0 refills | Status: DC

## 2017-08-05 ENCOUNTER — Encounter: Payer: Self-pay | Admitting: Nurse Practitioner

## 2017-08-05 ENCOUNTER — Ambulatory Visit (INDEPENDENT_AMBULATORY_CARE_PROVIDER_SITE_OTHER): Payer: 59 | Admitting: Nurse Practitioner

## 2017-08-05 VITALS — BP 136/86 | HR 67 | Resp 16 | Ht 60.0 in | Wt 141.0 lb

## 2017-08-05 DIAGNOSIS — K219 Gastro-esophageal reflux disease without esophagitis: Secondary | ICD-10-CM | POA: Diagnosis not present

## 2017-08-05 DIAGNOSIS — J3089 Other allergic rhinitis: Secondary | ICD-10-CM | POA: Diagnosis not present

## 2017-08-05 DIAGNOSIS — I10 Essential (primary) hypertension: Secondary | ICD-10-CM

## 2017-08-05 DIAGNOSIS — M5440 Lumbago with sciatica, unspecified side: Secondary | ICD-10-CM

## 2017-08-05 DIAGNOSIS — J302 Other seasonal allergic rhinitis: Secondary | ICD-10-CM

## 2017-08-05 DIAGNOSIS — G8929 Other chronic pain: Secondary | ICD-10-CM

## 2017-08-05 DIAGNOSIS — M064 Inflammatory polyarthropathy: Secondary | ICD-10-CM

## 2017-08-05 DIAGNOSIS — R110 Nausea: Secondary | ICD-10-CM

## 2017-08-05 DIAGNOSIS — G43909 Migraine, unspecified, not intractable, without status migrainosus: Secondary | ICD-10-CM | POA: Diagnosis not present

## 2017-08-05 DIAGNOSIS — J4520 Mild intermittent asthma, uncomplicated: Secondary | ICD-10-CM

## 2017-08-05 DIAGNOSIS — N959 Unspecified menopausal and perimenopausal disorder: Secondary | ICD-10-CM

## 2017-08-05 MED ORDER — PRAZOSIN HCL 1 MG PO CAPS: ORAL_CAPSULE | ORAL | 3 refills | Status: AC

## 2017-08-05 MED ORDER — FLUTICASONE-SALMETEROL 115-21 MCG/ACT IN AERO: 2.0000 | INHALATION_SPRAY | RESPIRATORY_TRACT | 4 refills | Status: DC | PRN

## 2017-08-05 MED ORDER — GABAPENTIN 300 MG PO CAPS: 300.0000 mg | ORAL_CAPSULE | Freq: Two times a day (BID) | ORAL | 4 refills | Status: DC

## 2017-08-05 MED ORDER — CYCLOBENZAPRINE HCL 10 MG PO TABS: 10.0000 mg | ORAL_TABLET | Freq: Three times a day (TID) | ORAL | 1 refills | Status: DC | PRN

## 2017-08-05 MED ORDER — BISOPROLOL-HYDROCHLOROTHIAZIDE 5-6.25 MG PO TABS: 1.0000 | ORAL_TABLET | Freq: Every day | ORAL | 4 refills | Status: DC

## 2017-08-05 MED ORDER — MONTELUKAST SODIUM 10 MG PO TABS: 10.0000 mg | ORAL_TABLET | Freq: Every day | ORAL | 4 refills | Status: DC

## 2017-08-05 MED ORDER — MOMETASONE FUROATE 50 MCG/ACT NA SUSP: 2.0000 | Freq: Every day | NASAL | 4 refills | Status: DC

## 2017-08-05 MED ORDER — RIZATRIPTAN BENZOATE 10 MG PO TABS: 10.0000 mg | ORAL_TABLET | ORAL | 4 refills | Status: DC | PRN

## 2017-08-05 MED ORDER — CETIRIZINE HCL 10 MG PO TABS: 10.0000 mg | ORAL_TABLET | Freq: Every day | ORAL | 4 refills | Status: AC

## 2017-08-05 MED ORDER — ESTRADIOL 0.1 MG/GM VA CREA: 1.0000 | TOPICAL_CREAM | VAGINAL | 4 refills | Status: DC

## 2017-08-05 MED ORDER — ALBUTEROL SULFATE HFA 108 (90 BASE) MCG/ACT IN AERS: 2.0000 | INHALATION_SPRAY | Freq: Four times a day (QID) | RESPIRATORY_TRACT | 4 refills | Status: DC | PRN

## 2017-08-05 MED ORDER — VERAPAMIL HCL 40 MG PO TABS: 40.0000 mg | ORAL_TABLET | Freq: Two times a day (BID) | ORAL | 1 refills | Status: DC

## 2017-08-05 MED ORDER — TRAMADOL HCL 50 MG PO TABS: 50.0000 mg | ORAL_TABLET | Freq: Three times a day (TID) | ORAL | 1 refills | Status: DC | PRN

## 2017-08-05 MED ORDER — PROPRANOLOL HCL ER 120 MG PO CP24: 120.0000 mg | ORAL_CAPSULE | Freq: Every day | ORAL | 4 refills | Status: DC

## 2017-08-05 MED ORDER — PROMETHAZINE HCL 12.5 MG PO TABS: ORAL_TABLET | ORAL | 1 refills | Status: DC

## 2017-08-05 MED ORDER — PANTOPRAZOLE SODIUM 20 MG PO TBEC: 20.0000 mg | DELAYED_RELEASE_TABLET | Freq: Every day | ORAL | 4 refills | Status: DC
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
## 2017-09-10 ENCOUNTER — Other Ambulatory Visit: Payer: Self-pay

## 2017-09-10 DIAGNOSIS — K219 Gastro-esophageal reflux disease without esophagitis: Secondary | ICD-10-CM

## 2017-11-12 ENCOUNTER — Encounter: Payer: Self-pay | Admitting: Nurse Practitioner

## 2017-11-12 ENCOUNTER — Ambulatory Visit (INDEPENDENT_AMBULATORY_CARE_PROVIDER_SITE_OTHER): Payer: 59 | Admitting: Nurse Practitioner

## 2017-11-12 VITALS — BP 136/72 | HR 66 | Resp 16 | Ht 60.0 in | Wt 140.0 lb

## 2017-11-12 DIAGNOSIS — I10 Essential (primary) hypertension: Secondary | ICD-10-CM | POA: Insufficient documentation

## 2017-11-12 DIAGNOSIS — E559 Vitamin D deficiency, unspecified: Secondary | ICD-10-CM

## 2017-11-12 DIAGNOSIS — D509 Iron deficiency anemia, unspecified: Secondary | ICD-10-CM | POA: Diagnosis not present

## 2017-11-12 DIAGNOSIS — N959 Unspecified menopausal and perimenopausal disorder: Secondary | ICD-10-CM

## 2017-11-12 DIAGNOSIS — L659 Nonscarring hair loss, unspecified: Secondary | ICD-10-CM | POA: Diagnosis not present

## 2017-11-12 NOTE — Progress Notes (Signed)
[facility] [place], [place] [address]  Internal MEDICINE  Office Visit Note  Patient Name: [name] [name]  [id number]  [id number]  Date of Service: 11/12/2017   Pt is here for a sick visit.  Chief Complaint  Patient presents with  . Alopecia     The patient is concerned about hair loss. Has been going on for some time. Initially thought it was due to having gastric sleeve surgery. She had her appointment with bariatric surgeon yesterday. He feels as though this should have stopped. Sleeve surgery was in 2015. She states that her hair comes out in clumps when she is in the shower and washes her hair. No bald spots are noted. Is frequently cold. She does have chronic constipation and dry, flaky skin.        Current Medication:  Outpatient Encounter Medications as of 11/12/2017  Medication Sig Note  . albuterol (PROVENTIL HFA;VENTOLIN HFA) 108 (90 Base) MCG/ACT inhaler Inhale 2 puffs into the lungs every 6 (six) hours as needed.   . ALPRAZolam (XANAX) 0.5 MG tablet TK 1 T PO TID 06/19/2015: Received from: External Pharmacy  . bisoprolol-hydrochlorothiazide (ZIAC) 5-6.25 MG tablet Take 1 tablet by mouth daily.   . busPIRone (BUSPAR) 10 MG tablet TK 1 T PO BID 06/19/2015: Received from: External Pharmacy  . cetirizine (ZYRTEC) 10 MG tablet Take 1 tablet (10 mg total) by mouth daily.   . cyclobenzaprine (FLEXERIL) 10 MG tablet Take 1 tablet (10 mg total) by mouth 3 (three) times daily as needed for muscle spasms.   Marland Kitchen estradiol (ESTRACE VAGINAL) 0.1 MG/GM vaginal cream Place 1 Applicatorful vaginally 3 (three) times a week.   . fluticasone-salmeterol (ADVAIR HFA) 115-21 MCG/ACT inhaler Inhale 2 puffs into the lungs as needed.   . gabapentin (NEURONTIN) 300 MG capsule Take 1 capsule (300 mg total) by mouth 2 (two) times daily.   Marland Kitchen HYDROcodone-acetaminophen (NORCO/VICODIN) 5-325 MG tablet Take 1 tablet by mouth every 6 (six) hours as needed for moderate pain.    . mometasone (NASONEX) 50 MCG/ACT nasal spray Place 2 sprays into the nose daily.   . montelukast (SINGULAIR) 10 MG tablet Take 1 tablet (10 mg total) by mouth at bedtime.   . pantoprazole (PROTONIX) 20 MG tablet Take 1 tablet (20 mg total) by mouth daily.   . prazosin (MINIPRESS) 1 MG capsule Take 1 capsule po QD and 2 capsules po QPM   . promethazine (PHENERGAN) 12.5 MG tablet Take 1 tab po twice a day as needed for nausea   . propranolol ER (INDERAL LA) 120 MG 24 hr capsule Take 1 capsule (120 mg total) by mouth at bedtime.   Marland Kitchen QUEtiapine (SEROQUEL) 100 MG tablet TK 1 T PO QHS 06/19/2015: Received from: External Pharmacy  . rizatriptan (MAXALT) 10 MG tablet Take 1 tablet (10 mg total) by mouth as needed for migraine. May repeat in 2 hours if needed   . sertraline (ZOLOFT) 50 MG tablet TK 1 T PO QD   . traMADol (ULTRAM) 50 MG tablet Take 1 tablet (50 mg total) by mouth 3 (three) times daily as needed.   . verapamil (CALAN) 40 MG tablet Take 1 tablet (40 mg total) by mouth 2 (two) times daily.   Marland Kitchen VITAMIN D, ERGOCALCIFEROL, PO Take by mouth. TAKE ONE TABLET TWICE PER WEEK   . zolpidem (AMBIEN) 10 MG tablet  06/19/2015: Received from: External Pharmacy   No facility-administered encounter medications on file as of 11/12/2017.  Medical History: Past Medical History:  Diagnosis Date  . Asthma   . Hypertension       Review of Systems  Constitutional: Positive for fatigue. Negative for activity change, chills and unexpected weight change.  HENT: Positive for postnasal drip. Negative for congestion, rhinorrhea, sneezing and sore throat.   Eyes: Negative.  Negative for redness.  Respiratory: Negative for cough, chest tightness, shortness of breath and wheezing.   Cardiovascular: Negative for chest pain and palpitations.       Stopped taking verapamil since now on addyi. Dong well. bp well managed   Gastrointestinal: Positive for constipation. Negative for abdominal pain, diarrhea,  nausea and vomiting.  Endocrine:       Hair loss  Genitourinary: Negative for dysuria and frequency.  Musculoskeletal: Positive for back pain and neck pain. Negative for arthralgias and joint swelling.  Skin: Negative for rash.       Dry skin  Allergic/Immunologic: Positive for environmental allergies.  Neurological: Positive for headaches. Negative for tremors and numbness.  Hematological: Negative for adenopathy. Does not bruise/bleed easily.  Psychiatric/Behavioral: Negative for behavioral problems (Depression), sleep disturbance and suicidal ideas. The patient is nervous/anxious.     Today's Vitals   11/12/17 1404  BP: 136/72  Pulse: 66  Resp: 16  SpO2: 99%  Weight: 140 lb (63.5 kg)  Height: 5' (1.524 m)   Physical Exam  Constitutional: She is oriented to person, place, and time. She appears well-developed and well-nourished. No distress.  HENT:  Head: Normocephalic and atraumatic.  Mouth/Throat: Oropharynx is clear and moist. No oropharyngeal exudate.  Eyes: Pupils are equal, round, and reactive to light. EOM are normal.  Neck: Normal range of motion. Neck supple. No JVD present. Carotid bruit is not present. No tracheal deviation present. No thyromegaly present.  Cardiovascular: Normal rate, regular rhythm and normal heart sounds. Exam reveals no gallop and no friction rub.  No murmur heard. Pulmonary/Chest: Effort normal and breath sounds normal. No respiratory distress. She has no wheezes. She has no rales. She exhibits no tenderness.  Abdominal: Soft. Bowel sounds are normal. There is no tenderness.  Musculoskeletal: Normal range of motion.  Lymphadenopathy:    She has no cervical adenopathy.  Neurological: She is alert and oriented to person, place, and time. No cranial nerve deficit.  Skin: Skin is warm and dry. She is not diaphoretic.  Psychiatric: She has a normal mood and affect. Her behavior is normal. Judgment and thought content normal.  Nursing note and vitals  reviewed.   Assessment/Plan: 1. Essential hypertension Stable bp, continue medications a prescribed. Routine fasting labs ordered.  - CBC with Differential/Platelet - Lipid panel  2. Alopecia Check labs.  - Comprehensive metabolic panel - TSH - T4, free - FSH/LH - Estradiol  3. Unspecified menopausal and perimenopausal disorder - FSH/LH - Estradiol  4. Vitamin D deficiency - Vitamin D 1,25 dihydroxy  5. Iron deficiency anemia, unspecified iron deficiency anemia type - Iron, TIBC and Ferritin Panel - Vitamin B12 - Folate  General Counseling: Tanaya verbalizes understanding of the findings of todays visit and agrees with plan of treatment. I have discussed any further diagnostic evaluation that may be needed or ordered today. We also reviewed her medications today. she has been encouraged to call the office with any questions or concerns that should arise related to todays visit.    Counseling:    Orders Placed This Encounter  Procedures  . CBC with Differential/Platelet  . Comprehensive metabolic panel  . Lipid panel  .  TSH  . T4, free  . Vitamin D 1,25 dihydroxy  . Iron, TIBC and Ferritin Panel  . Vitamin B12  . Folate  . FSH/LH  . Estradiol   This patient was seen by Leretha Pol, FNP- C in Collaboration with Dr Lavera Guise as a part of collaborative care agreement    Time spent: 15 Minutes

## 2017-11-18 ENCOUNTER — Other Ambulatory Visit: Payer: Self-pay | Admitting: Nurse Practitioner

## 2017-11-19 ENCOUNTER — Encounter: Payer: Self-pay | Admitting: Nurse Practitioner

## 2017-11-19 LAB — VITAMIN D 25 HYDROXY (VIT D DEFICIENCY, FRACTURES): Vit D, 25-Hydroxy: 61.4 ng/mL (ref 30.0–100.0)

## 2017-11-19 LAB — CBC
HEMATOCRIT: 39.6 % (ref 34.0–46.6)
Hemoglobin: 13.3 g/dL (ref 11.1–15.9)
MCH: 30.4 pg (ref 26.6–33.0)
MCHC: 33.6 g/dL (ref 31.5–35.7)
MCV: 90 fL (ref 79–97)
Platelets: 290 10*3/uL (ref 150–450)
RBC: 4.38 x10E6/uL (ref 3.77–5.28)
RDW: 13.5 % (ref 12.3–15.4)
WBC: 7 10*3/uL (ref 3.4–10.8)

## 2017-11-19 LAB — COMPREHENSIVE METABOLIC PANEL
ALBUMIN: 4.3 g/dL (ref 3.5–5.5)
ALK PHOS: 101 IU/L (ref 39–117)
ALT: 36 IU/L — ABNORMAL HIGH (ref 0–32)
AST: 22 IU/L (ref 0–40)
Albumin/Globulin Ratio: 1.7 (ref 1.2–2.2)
BILIRUBIN TOTAL: 0.3 mg/dL (ref 0.0–1.2)
BUN / CREAT RATIO: 21 (ref 9–23)
BUN: 16 mg/dL (ref 6–24)
CHLORIDE: 104 mmol/L (ref 96–106)
CO2: 24 mmol/L (ref 20–29)
Calcium: 9.4 mg/dL (ref 8.7–10.2)
Creatinine, Ser: 0.78 mg/dL (ref 0.57–1.00)
GFR calc Af Amer: 100 mL/min/{1.73_m2} (ref >59)
GFR calc non Af Amer: 86 mL/min/{1.73_m2} (ref >59)
GLUCOSE: 88 mg/dL (ref 65–99)
Globulin, Total: 2.5 g/dL (ref 1.5–4.5)
Potassium: 4.3 mmol/L (ref 3.5–5.2)
SODIUM: 142 mmol/L (ref 134–144)
Total Protein: 6.8 g/dL (ref 6.0–8.5)

## 2017-11-19 LAB — LIPID PANEL W/O CHOL/HDL RATIO
CHOLESTEROL TOTAL: 212 mg/dL — AB (ref 100–199)
HDL: 76 mg/dL (ref >39)
LDL Calculated: 119 mg/dL — ABNORMAL HIGH (ref 0–99)
Triglycerides: 83 mg/dL (ref 0–149)
VLDL Cholesterol Cal: 17 mg/dL (ref 5–40)

## 2017-11-19 LAB — FSH/LH
FSH: 90.3 m[IU]/mL
LH: 49.6 m[IU]/mL

## 2017-11-19 LAB — IRON AND TIBC
Iron Saturation: 24 % (ref 15–55)
Iron: 86 ug/dL (ref 27–159)
Total Iron Binding Capacity: 352 ug/dL (ref 250–450)
UIBC: 266 ug/dL (ref 131–425)

## 2017-11-19 LAB — T4, FREE: FREE T4: 1.27 ng/dL (ref 0.82–1.77)

## 2017-11-19 LAB — B12 AND FOLATE PANEL: VITAMIN B 12: 1577 pg/mL — AB (ref 232–1245)

## 2017-11-19 LAB — TSH: TSH: 1.59 u[IU]/mL (ref 0.450–4.500)

## 2017-11-19 LAB — ESTRADIOL: Estradiol: <5 pg/mL

## 2017-11-19 LAB — FERRITIN: FERRITIN: 24 ng/mL (ref 15–150)

## 2017-12-29 ENCOUNTER — Telehealth: Payer: Self-pay

## 2017-12-29 ENCOUNTER — Other Ambulatory Visit: Payer: Self-pay | Admitting: Nurse Practitioner

## 2017-12-29 ENCOUNTER — Other Ambulatory Visit: Payer: Self-pay

## 2017-12-29 DIAGNOSIS — B370 Candidal stomatitis: Secondary | ICD-10-CM

## 2017-12-29 MED ORDER — CLOTRIMAZOLE 10 MG MT TROC: 10.0000 mg | Freq: Every day | OROMUCOSAL | 1 refills | Status: DC

## 2017-12-29 NOTE — Telephone Encounter (Signed)
Patient c/o thrush due to use of advair. Use. Sent in rx for clotrimazole 10mg  lozenge which can be used up to 5 times daily for next 7 to 10 days. Sent to [facility]  [address]

## 2017-12-29 NOTE — Progress Notes (Signed)
Patient c/o thrush due to use of advair. Use. Sent in rx for clotrimazole 10mg  lozenge which can be used up to 5 times daily for next 7 to 10 days. Sent to [facility]  [address]

## 2017-12-31 ENCOUNTER — Ambulatory Visit: Payer: Self-pay | Admitting: Nurse Practitioner

## 2018-01-14 ENCOUNTER — Other Ambulatory Visit: Payer: Self-pay

## 2018-01-14 DIAGNOSIS — B370 Candidal stomatitis: Secondary | ICD-10-CM

## 2018-01-14 MED ORDER — CLOTRIMAZOLE 10 MG MT TROC: 10.0000 mg | Freq: Every day | OROMUCOSAL | 1 refills | Status: DC

## 2018-02-03 ENCOUNTER — Ambulatory Visit (INDEPENDENT_AMBULATORY_CARE_PROVIDER_SITE_OTHER): Payer: 59 | Admitting: Nurse Practitioner

## 2018-02-03 ENCOUNTER — Encounter: Payer: Self-pay | Admitting: Nurse Practitioner

## 2018-02-03 VITALS — BP 133/86 | HR 59 | Resp 16 | Ht 63.0 in | Wt 132.8 lb

## 2018-02-03 DIAGNOSIS — Z1239 Encounter for other screening for malignant neoplasm of breast: Secondary | ICD-10-CM

## 2018-02-03 DIAGNOSIS — F4310 Post-traumatic stress disorder, unspecified: Secondary | ICD-10-CM | POA: Insufficient documentation

## 2018-02-03 DIAGNOSIS — I10 Essential (primary) hypertension: Secondary | ICD-10-CM | POA: Diagnosis not present

## 2018-02-03 DIAGNOSIS — Z1231 Encounter for screening mammogram for malignant neoplasm of breast: Secondary | ICD-10-CM | POA: Diagnosis not present

## 2018-02-03 DIAGNOSIS — G43909 Migraine, unspecified, not intractable, without status migrainosus: Secondary | ICD-10-CM

## 2018-02-03 MED ORDER — SERTRALINE HCL 100 MG PO TABS: 100.0000 mg | ORAL_TABLET | Freq: Every day | ORAL | 4 refills | Status: DC
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
## 2018-02-28 ENCOUNTER — Telehealth: Payer: Self-pay | Admitting: Internal Medicine

## 2018-02-28 NOTE — Telephone Encounter (Signed)
FAXED [place].JW

## 2018-03-01 ENCOUNTER — Telehealth: Payer: Self-pay

## 2018-03-01 NOTE — Telephone Encounter (Signed)
Pt called that she want you to do her tramadol for 90 day

## 2018-03-02 ENCOUNTER — Other Ambulatory Visit: Payer: Self-pay | Admitting: Nurse Practitioner

## 2018-03-02 DIAGNOSIS — M064 Inflammatory polyarthropathy: Secondary | ICD-10-CM

## 2018-03-02 MED ORDER — TRAMADOL HCL 50 MG PO TABS: 50.0000 mg | ORAL_TABLET | Freq: Two times a day (BID) | ORAL | 1 refills | Status: DC | PRN

## 2018-03-02 NOTE — Progress Notes (Signed)
Changed tramadol to reflect BID dosing prn and sent #180 with 1 refill to [facility].

## 2018-03-02 NOTE — Telephone Encounter (Signed)
Changed tramadol to reflect BID dosing prn and sent #180 with 1 refill to [facility].

## 2018-03-10 ENCOUNTER — Ambulatory Visit (INDEPENDENT_AMBULATORY_CARE_PROVIDER_SITE_OTHER): Payer: 59 | Admitting: Adult Health

## 2018-03-10 ENCOUNTER — Encounter: Payer: Self-pay | Admitting: Adult Health

## 2018-03-10 VITALS — BP 148/102 | HR 72 | Resp 16 | Ht 60.0 in | Wt 138.0 lb

## 2018-03-10 DIAGNOSIS — M5440 Lumbago with sciatica, unspecified side: Secondary | ICD-10-CM | POA: Diagnosis not present

## 2018-03-10 DIAGNOSIS — G8929 Other chronic pain: Secondary | ICD-10-CM | POA: Diagnosis not present

## 2018-03-10 DIAGNOSIS — I10 Essential (primary) hypertension: Secondary | ICD-10-CM

## 2018-03-10 DIAGNOSIS — M064 Inflammatory polyarthropathy: Secondary | ICD-10-CM

## 2018-03-10 MED ORDER — CYCLOBENZAPRINE HCL 10 MG PO TABS: 10.0000 mg | ORAL_TABLET | Freq: Three times a day (TID) | ORAL | 1 refills | Status: DC | PRN
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
## 2018-03-10 NOTE — Patient Instructions (Signed)

## 2018-05-12 ENCOUNTER — Ambulatory Visit: Payer: 59 | Admitting: Nurse Practitioner

## 2018-05-12 ENCOUNTER — Encounter: Payer: Self-pay | Admitting: Nurse Practitioner

## 2018-05-12 VITALS — BP 149/93 | HR 64 | Resp 16 | Ht 60.0 in | Wt 141.6 lb

## 2018-05-12 DIAGNOSIS — G43909 Migraine, unspecified, not intractable, without status migrainosus: Secondary | ICD-10-CM | POA: Diagnosis not present

## 2018-05-12 DIAGNOSIS — K449 Diaphragmatic hernia without obstruction or gangrene: Secondary | ICD-10-CM | POA: Insufficient documentation

## 2018-05-12 DIAGNOSIS — I10 Essential (primary) hypertension: Secondary | ICD-10-CM | POA: Diagnosis not present

## 2018-05-12 DIAGNOSIS — J3089 Other allergic rhinitis: Secondary | ICD-10-CM

## 2018-05-12 DIAGNOSIS — J302 Other seasonal allergic rhinitis: Secondary | ICD-10-CM

## 2018-05-12 DIAGNOSIS — J4520 Mild intermittent asthma, uncomplicated: Secondary | ICD-10-CM | POA: Diagnosis not present

## 2018-05-12 DIAGNOSIS — M064 Inflammatory polyarthropathy: Secondary | ICD-10-CM

## 2018-05-12 DIAGNOSIS — K219 Gastro-esophageal reflux disease without esophagitis: Secondary | ICD-10-CM | POA: Insufficient documentation

## 2018-05-12 DIAGNOSIS — K760 Fatty (change of) liver, not elsewhere classified: Secondary | ICD-10-CM | POA: Insufficient documentation

## 2018-05-12 MED ORDER — MONTELUKAST SODIUM 10 MG PO TABS: 10.0000 mg | ORAL_TABLET | Freq: Every day | ORAL | 4 refills | Status: DC

## 2018-05-12 MED ORDER — MOMETASONE FUROATE 50 MCG/ACT NA SUSP: 2.0000 | Freq: Every day | NASAL | 4 refills | Status: DC

## 2018-05-12 MED ORDER — FLUTICASONE-SALMETEROL 115-21 MCG/ACT IN AERO: 2.0000 | INHALATION_SPRAY | RESPIRATORY_TRACT | 3 refills | Status: DC | PRN

## 2018-05-12 MED ORDER — ALBUTEROL SULFATE HFA 108 (90 BASE) MCG/ACT IN AERS
2.0000 | INHALATION_SPRAY | Freq: Four times a day (QID) | RESPIRATORY_TRACT | 3 refills | Status: DC | PRN
Start: 2018-05-12 — End: 2019-04-27
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
## 2018-05-13 ENCOUNTER — Other Ambulatory Visit: Payer: Self-pay

## 2018-05-13 DIAGNOSIS — J4520 Mild intermittent asthma, uncomplicated: Secondary | ICD-10-CM

## 2018-05-13 MED ORDER — FLUTICASONE-SALMETEROL 115-21 MCG/ACT IN AERO: 2.0000 | INHALATION_SPRAY | Freq: Two times a day (BID) | RESPIRATORY_TRACT | 3 refills | Status: DC | PRN

## 2018-05-18 DIAGNOSIS — J302 Other seasonal allergic rhinitis: Secondary | ICD-10-CM | POA: Insufficient documentation

## 2018-05-18 DIAGNOSIS — J3089 Other allergic rhinitis: Secondary | ICD-10-CM | POA: Insufficient documentation

## 2018-05-18 DIAGNOSIS — M064 Inflammatory polyarthropathy: Secondary | ICD-10-CM | POA: Insufficient documentation

## 2018-05-18 DIAGNOSIS — J4520 Mild intermittent asthma, uncomplicated: Secondary | ICD-10-CM | POA: Insufficient documentation

## 2018-06-14 ENCOUNTER — Other Ambulatory Visit: Payer: Self-pay | Admitting: Nurse Practitioner

## 2018-06-14 ENCOUNTER — Telehealth: Payer: Self-pay

## 2018-06-14 DIAGNOSIS — J101 Influenza due to other identified influenza virus with other respiratory manifestations: Secondary | ICD-10-CM

## 2018-06-14 MED ORDER — OSELTAMIVIR PHOSPHATE 75 MG PO CAPS: 75.0000 mg | ORAL_CAPSULE | Freq: Two times a day (BID) | ORAL | 0 refills | Status: DC

## 2018-06-14 NOTE — Telephone Encounter (Signed)
Patient diagnosed with influenza A. Sent tamiflu 75mg  twice daily for 5 days to [facility].

## 2018-06-14 NOTE — Progress Notes (Signed)
Patient diagnosed with influenza A. Sent tamiflu 75mg  twice daily for 5 days to [facility].

## 2018-08-08 ENCOUNTER — Other Ambulatory Visit: Payer: Self-pay

## 2018-08-08 MED ORDER — VERAPAMIL HCL 40 MG PO TABS: ORAL_TABLET | ORAL | 5 refills | Status: DC

## 2018-08-09 ENCOUNTER — Ambulatory Visit: Payer: Self-pay | Admitting: Nurse Practitioner

## 2018-08-12 ENCOUNTER — Telehealth: Payer: Self-pay | Admitting: Nurse Practitioner

## 2018-08-12 ENCOUNTER — Encounter: Payer: Self-pay | Admitting: Nurse Practitioner

## 2018-08-12 ENCOUNTER — Ambulatory Visit: Payer: 59 | Admitting: Nurse Practitioner

## 2018-08-12 VITALS — BP 110/79 | HR 75 | Resp 16 | Ht 60.0 in | Wt 140.4 lb

## 2018-08-12 DIAGNOSIS — K219 Gastro-esophageal reflux disease without esophagitis: Secondary | ICD-10-CM

## 2018-08-12 DIAGNOSIS — G43909 Migraine, unspecified, not intractable, without status migrainosus: Secondary | ICD-10-CM | POA: Diagnosis not present

## 2018-08-12 DIAGNOSIS — I10 Essential (primary) hypertension: Secondary | ICD-10-CM | POA: Diagnosis not present

## 2018-08-12 DIAGNOSIS — M064 Inflammatory polyarthropathy: Secondary | ICD-10-CM

## 2018-08-12 DIAGNOSIS — N959 Unspecified menopausal and perimenopausal disorder: Secondary | ICD-10-CM

## 2018-08-12 MED ORDER — PANTOPRAZOLE SODIUM 20 MG PO TBEC: 20.0000 mg | DELAYED_RELEASE_TABLET | Freq: Every day | ORAL | 4 refills | Status: DC

## 2018-08-12 MED ORDER — TRAMADOL HCL 50 MG PO TABS: 50.0000 mg | ORAL_TABLET | Freq: Three times a day (TID) | ORAL | 3 refills | Status: DC | PRN

## 2018-08-12 MED ORDER — ESTRADIOL 0.1 MG/GM VA CREA: 1.0000 | TOPICAL_CREAM | VAGINAL | 4 refills | Status: DC

## 2018-08-12 MED ORDER — RIZATRIPTAN BENZOATE 10 MG PO TABS: 10.0000 mg | ORAL_TABLET | ORAL | 5 refills | Status: DC | PRN

## 2018-08-12 MED ORDER — PROPRANOLOL HCL ER 120 MG PO CP24: 120.0000 mg | ORAL_CAPSULE | Freq: Every day | ORAL | 4 refills | Status: DC

## 2018-08-12 NOTE — Telephone Encounter (Signed)
Prior authorization for more then a 7 day supply for Tramadol has been approved by insurance , pharmacy and patient notified
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
## 2018-08-22 ENCOUNTER — Other Ambulatory Visit: Payer: Self-pay

## 2018-08-22 DIAGNOSIS — G43909 Migraine, unspecified, not intractable, without status migrainosus: Secondary | ICD-10-CM

## 2018-08-22 DIAGNOSIS — I10 Essential (primary) hypertension: Secondary | ICD-10-CM

## 2018-08-22 MED ORDER — PROPRANOLOL HCL ER 120 MG PO CP24: 120.0000 mg | ORAL_CAPSULE | Freq: Every day | ORAL | 1 refills | Status: DC

## 2018-10-19 ENCOUNTER — Other Ambulatory Visit: Payer: Self-pay | Admitting: Adult Health

## 2018-10-19 DIAGNOSIS — G8929 Other chronic pain: Secondary | ICD-10-CM

## 2018-10-19 DIAGNOSIS — M5440 Lumbago with sciatica, unspecified side: Secondary | ICD-10-CM

## 2018-12-15 ENCOUNTER — Ambulatory Visit: Payer: Self-pay | Admitting: Nurse Practitioner

## 2018-12-16 ENCOUNTER — Encounter: Payer: Self-pay | Admitting: Nurse Practitioner

## 2018-12-16 ENCOUNTER — Ambulatory Visit (INDEPENDENT_AMBULATORY_CARE_PROVIDER_SITE_OTHER): Payer: 59 | Admitting: Nurse Practitioner

## 2018-12-16 ENCOUNTER — Other Ambulatory Visit: Payer: Self-pay

## 2018-12-16 VITALS — BP 137/78 | HR 73 | Temp 97.8°F | Resp 16 | Ht 64.0 in | Wt 146.2 lb

## 2018-12-16 DIAGNOSIS — M064 Inflammatory polyarthropathy: Secondary | ICD-10-CM | POA: Diagnosis not present

## 2018-12-16 DIAGNOSIS — J4520 Mild intermittent asthma, uncomplicated: Secondary | ICD-10-CM | POA: Diagnosis not present

## 2018-12-16 DIAGNOSIS — G43909 Migraine, unspecified, not intractable, without status migrainosus: Secondary | ICD-10-CM | POA: Diagnosis not present

## 2018-12-16 DIAGNOSIS — I10 Essential (primary) hypertension: Secondary | ICD-10-CM | POA: Diagnosis not present

## 2018-12-16 MED ORDER — TRAMADOL HCL 50 MG PO TABS: 50.0000 mg | ORAL_TABLET | Freq: Three times a day (TID) | ORAL | 3 refills | Status: DC | PRN
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
## 2019-01-06 ENCOUNTER — Other Ambulatory Visit: Payer: Self-pay

## 2019-01-06 MED ORDER — VERAPAMIL HCL 40 MG PO TABS: ORAL_TABLET | ORAL | 5 refills | Status: DC

## 2019-01-23 ENCOUNTER — Other Ambulatory Visit: Payer: Self-pay

## 2019-01-23 DIAGNOSIS — G43909 Migraine, unspecified, not intractable, without status migrainosus: Secondary | ICD-10-CM

## 2019-01-23 DIAGNOSIS — I10 Essential (primary) hypertension: Secondary | ICD-10-CM

## 2019-01-23 MED ORDER — PROPRANOLOL HCL ER 120 MG PO CP24: 120.0000 mg | ORAL_CAPSULE | Freq: Every day | ORAL | 1 refills | Status: DC

## 2019-03-22 ENCOUNTER — Ambulatory Visit (INDEPENDENT_AMBULATORY_CARE_PROVIDER_SITE_OTHER): Payer: 59 | Admitting: Podiatry

## 2019-03-22 ENCOUNTER — Other Ambulatory Visit: Payer: Self-pay

## 2019-03-22 ENCOUNTER — Encounter: Payer: Self-pay | Admitting: Podiatry

## 2019-03-22 ENCOUNTER — Ambulatory Visit (INDEPENDENT_AMBULATORY_CARE_PROVIDER_SITE_OTHER): Payer: 59

## 2019-03-22 VITALS — BP 130/72 | HR 60 | Resp 16

## 2019-03-22 DIAGNOSIS — M2012 Hallux valgus (acquired), left foot: Secondary | ICD-10-CM | POA: Diagnosis not present
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
## 2019-03-22 NOTE — Patient Instructions (Addendum)
Pre-Operative Instructions  Congratulations, you have decided to take an important step towards improving your quality of life.  You can be assured that the doctors and staff at [facility] will be with you every step of the way.  Here are some important things you should know:  1. Plan to be at the surgery center/hospital at least 1 (one) hour prior to your scheduled time, unless otherwise directed by the surgical center/hospital staff.  You must have a responsible adult accompany you, remain during the surgery and drive you home.  Make sure you have directions to the surgical center/hospital to ensure you arrive on time. 2. If you are having surgery at [facility] or [facility], you will need a copy of your medical history and physical form from your family physician within one month prior to the date of surgery. We will give you a form for your primary physician to complete.  3. We make every effort to accommodate the date you request for surgery.  However, there are times where surgery dates or times have to be moved.  We will contact you as soon as possible if a change in schedule is required.   4. No aspirin/ibuprofen for one week before surgery.  If you are on aspirin, any non-steroidal anti-inflammatory medications (Mobic, Aleve, Ibuprofen) should not be taken seven (7) days prior to your surgery.  You make take Tylenol for pain prior to surgery.  5. Medications - If you are taking daily heart and blood pressure medications, seizure, reflux, allergy, asthma, anxiety, pain or diabetes medications, make sure you notify the surgery center/hospital before the day of surgery so they can tell you which medications you should take or avoid the day of surgery. 6. No food or drink after midnight the night before surgery unless directed otherwise by surgical center/hospital staff. 7. No alcoholic beverages 24-hours prior to surgery.  No smoking 24-hours prior or 24-hours after  surgery. 8. Wear loose pants or shorts. They should be loose enough to fit over bandages, boots, and casts. 9. Don't wear slip-on shoes. Sneakers are preferred. 10. Bring your boot with you to the surgery center/hospital.  Also bring crutches or a walker if your physician has prescribed it for you.  If you do not have this equipment, it will be provided for you after surgery. 11. If you have not been contacted by the surgery center/hospital by the day before your surgery, call to confirm the date and time of your surgery. 12. Leave-time from work may vary depending on the type of surgery you have.  Appropriate arrangements should be made prior to surgery with your employer. 13. Prescriptions will be provided immediately following surgery by your doctor.  Fill these as soon as possible after surgery and take the medication as directed. Pain medications will not be refilled on weekends and must be approved by the doctor. 14. Remove nail polish on the operative foot and avoid getting pedicures prior to surgery. 15. Wash the night before surgery.  The night before surgery wash the foot and leg well with water and the antibacterial soap provided. Be sure to pay special attention to beneath the toenails and in between the toes.  Wash for at least three (3) minutes. Rinse thoroughly with water and dry well with a towel.  Perform this wash unless told not to do so by your physician.  Enclosed: 1 Ice pack (please put in freezer the night before surgery)   1 Hibiclens skin cleaner     Pre-op instructions  If you have any questions regarding the instructions, please do not hesitate to call our office.  [place]: [address] [place], [place] [address] -- [place]: [address] [place], [place] [address] -- [phone]  [place]: [address], [place] [address] -- [phone]   Website: [email or web address] she is toenails

## 2019-03-24 ENCOUNTER — Telehealth: Payer: Self-pay | Admitting: Podiatry

## 2019-03-24 NOTE — Telephone Encounter (Signed)
Pt called to schedule sx on her Christmas break. Pt requested date of 05/26/2019. I told her I would go ahead and get her scheduled and that she could go on and register online with the surgical center. I also told her someone would call a day or two prior to let her know what time to arrive as I could not give her a time. Pt asked if she still had to register with them since she had treatment there before for her back. I told her to go ahead or someone from there could call and get the information over the phone. Told pt to call with any other questions and/or concerns.

## 2019-04-17 ENCOUNTER — Other Ambulatory Visit: Payer: Self-pay

## 2019-04-17 DIAGNOSIS — G8929 Other chronic pain: Secondary | ICD-10-CM

## 2019-04-17 DIAGNOSIS — M5440 Lumbago with sciatica, unspecified side: Secondary | ICD-10-CM

## 2019-04-17 MED ORDER — CYCLOBENZAPRINE HCL 10 MG PO TABS: ORAL_TABLET | ORAL | 0 refills | Status: DC

## 2019-04-19 ENCOUNTER — Telehealth: Payer: Self-pay

## 2019-04-19 NOTE — Telephone Encounter (Signed)
LEFT MESSAGE FOR PATIENT TO CONFIRM AND GO OVER SCREENING QUESTIONS FOR 04-21-19 APPOINTMENT.

## 2019-04-21 ENCOUNTER — Ambulatory Visit: Payer: 59 | Admitting: Nurse Practitioner

## 2019-04-25 ENCOUNTER — Telehealth: Payer: Self-pay

## 2019-04-25 NOTE — Telephone Encounter (Signed)
Patient called in regards to my chart  Message for her missed appointment on 04-21-19. She stated that she did not receive a call about the reminder and I advised patient that I am the one who does the reminder calls for this provider. She stated at that time that she did see the vm and apologized for overlooking it. I did advise patient that it would be a $25 missed appointment fee along with her copay for the next scheduled ov. Patient was in agreement.

## 2019-04-27 ENCOUNTER — Other Ambulatory Visit: Payer: Self-pay

## 2019-04-27 ENCOUNTER — Encounter: Payer: Self-pay | Admitting: Nurse Practitioner

## 2019-04-27 ENCOUNTER — Ambulatory Visit: Payer: 59 | Admitting: Nurse Practitioner

## 2019-04-27 VITALS — BP 131/72 | HR 76 | Temp 97.5°F | Resp 16 | Ht 64.0 in | Wt 160.8 lb

## 2019-04-27 DIAGNOSIS — M064 Inflammatory polyarthropathy: Secondary | ICD-10-CM

## 2019-04-27 DIAGNOSIS — I10 Essential (primary) hypertension: Secondary | ICD-10-CM | POA: Diagnosis not present

## 2019-04-27 DIAGNOSIS — G43909 Migraine, unspecified, not intractable, without status migrainosus: Secondary | ICD-10-CM

## 2019-04-27 DIAGNOSIS — Z6827 Body mass index (BMI) 27.0-27.9, adult: Secondary | ICD-10-CM

## 2019-04-27 DIAGNOSIS — J302 Other seasonal allergic rhinitis: Secondary | ICD-10-CM

## 2019-04-27 DIAGNOSIS — J3089 Other allergic rhinitis: Secondary | ICD-10-CM

## 2019-04-27 DIAGNOSIS — J4520 Mild intermittent asthma, uncomplicated: Secondary | ICD-10-CM

## 2019-04-27 MED ORDER — MONTELUKAST SODIUM 10 MG PO TABS: 10.0000 mg | ORAL_TABLET | Freq: Every day | ORAL | 3 refills | Status: DC

## 2019-04-27 MED ORDER — PHENTERMINE HCL 37.5 MG PO TABS: 37.5000 mg | ORAL_TABLET | Freq: Every day | ORAL | 1 refills | Status: DC

## 2019-04-27 MED ORDER — RIZATRIPTAN BENZOATE 10 MG PO TABS: 10.0000 mg | ORAL_TABLET | ORAL | 5 refills | Status: DC | PRN

## 2019-04-27 MED ORDER — MOMETASONE FUROATE 50 MCG/ACT NA SUSP: 2.0000 | Freq: Every day | NASAL | 3 refills | Status: DC

## 2019-04-27 MED ORDER — ALBUTEROL SULFATE HFA 108 (90 BASE) MCG/ACT IN AERS: 2.0000 | INHALATION_SPRAY | Freq: Four times a day (QID) | RESPIRATORY_TRACT | 5 refills | Status: DC | PRN

## 2019-04-27 MED ORDER — TRAMADOL HCL 50 MG PO TABS: 50.0000 mg | ORAL_TABLET | Freq: Three times a day (TID) | ORAL | 3 refills | Status: DC | PRN

## 2019-04-27 MED ORDER — FLUTICASONE-SALMETEROL 115-21 MCG/ACT IN AERO: 2.0000 | INHALATION_SPRAY | Freq: Two times a day (BID) | RESPIRATORY_TRACT | 3 refills | Status: DC | PRN

## 2019-04-27 MED ORDER — PROPRANOLOL HCL ER 120 MG PO CP24: 120.0000 mg | ORAL_CAPSULE | Freq: Every day | ORAL | 1 refills | Status: DC
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
## 2019-05-07 DIAGNOSIS — Z6827 Body mass index (BMI) 27.0-27.9, adult: Secondary | ICD-10-CM | POA: Insufficient documentation

## 2019-05-18 ENCOUNTER — Telehealth: Payer: Self-pay | Admitting: Podiatry

## 2019-05-18 NOTE — Telephone Encounter (Signed)
I'm scheduled for surgery next Friday 12/18 and I don't have the boot. I told the pt she could come by the [facility] office to get fitted and dispensed a boot.

## 2019-05-25 ENCOUNTER — Other Ambulatory Visit: Payer: Self-pay | Admitting: Podiatry

## 2019-05-25 MED ORDER — OXYCODONE-ACETAMINOPHEN 10-325 MG PO TABS: 1.0000 | ORAL_TABLET | ORAL | 0 refills | Status: DC | PRN

## 2019-05-25 MED ORDER — ONDANSETRON HCL 4 MG PO TABS: 4.0000 mg | ORAL_TABLET | Freq: Three times a day (TID) | ORAL | 0 refills | Status: DC | PRN

## 2019-05-25 MED ORDER — CEPHALEXIN 500 MG PO CAPS: 500.0000 mg | ORAL_CAPSULE | Freq: Three times a day (TID) | ORAL | 0 refills | Status: DC

## 2019-05-26 ENCOUNTER — Encounter: Payer: Self-pay | Admitting: Podiatry

## 2019-05-26 DIAGNOSIS — M7752 Other enthesopathy of left foot: Secondary | ICD-10-CM

## 2019-05-26 DIAGNOSIS — M2012 Hallux valgus (acquired), left foot: Secondary | ICD-10-CM | POA: Diagnosis not present

## 2019-05-30 ENCOUNTER — Encounter: Payer: 59 | Admitting: Podiatry

## 2019-05-31 ENCOUNTER — Ambulatory Visit (INDEPENDENT_AMBULATORY_CARE_PROVIDER_SITE_OTHER): Payer: 59 | Admitting: Podiatry

## 2019-05-31 ENCOUNTER — Ambulatory Visit (INDEPENDENT_AMBULATORY_CARE_PROVIDER_SITE_OTHER): Payer: 59

## 2019-05-31 ENCOUNTER — Encounter: Payer: 59 | Admitting: Podiatry

## 2019-05-31 ENCOUNTER — Other Ambulatory Visit: Payer: Self-pay

## 2019-05-31 ENCOUNTER — Ambulatory Visit: Payer: 59

## 2019-05-31 VITALS — BP 113/71 | HR 69 | Temp 98.6°F

## 2019-05-31 DIAGNOSIS — Z9889 Other specified postprocedural states: Secondary | ICD-10-CM

## 2019-05-31 DIAGNOSIS — M2012 Hallux valgus (acquired), left foot: Secondary | ICD-10-CM | POA: Diagnosis not present

## 2019-05-31 DIAGNOSIS — J3089 Other allergic rhinitis: Secondary | ICD-10-CM

## 2019-05-31 DIAGNOSIS — J302 Other seasonal allergic rhinitis: Secondary | ICD-10-CM

## 2019-05-31 MED ORDER — MOMETASONE FUROATE 50 MCG/ACT NA SUSP: 2.0000 | Freq: Every day | NASAL | 3 refills | Status: DC

## 2019-05-31 MED ORDER — HYDROCODONE-ACETAMINOPHEN 10-325 MG PO TABS: 1.0000 | ORAL_TABLET | ORAL | 0 refills | Status: AC | PRN

## 2019-06-06 ENCOUNTER — Telehealth: Payer: Self-pay

## 2019-06-06 ENCOUNTER — Other Ambulatory Visit: Payer: Self-pay

## 2019-06-06 DIAGNOSIS — J3089 Other allergic rhinitis: Secondary | ICD-10-CM

## 2019-06-06 DIAGNOSIS — J302 Other seasonal allergic rhinitis: Secondary | ICD-10-CM

## 2019-06-06 MED ORDER — MOMETASONE FUROATE 50 MCG/ACT NA SUSP: 2.0000 | Freq: Every day | NASAL | 3 refills | Status: DC

## 2019-06-06 NOTE — Telephone Encounter (Signed)
Prior authorization for pantoprazole 20mg  has been approved from 06/06/2019-lifetime/tat

## 2019-06-07 ENCOUNTER — Encounter: Payer: Self-pay | Admitting: Podiatry

## 2019-06-07 ENCOUNTER — Other Ambulatory Visit: Payer: Self-pay

## 2019-06-07 ENCOUNTER — Ambulatory Visit (INDEPENDENT_AMBULATORY_CARE_PROVIDER_SITE_OTHER): Payer: 59 | Admitting: Podiatry

## 2019-06-07 DIAGNOSIS — Z9889 Other specified postprocedural states: Secondary | ICD-10-CM

## 2019-06-07 DIAGNOSIS — M2012 Hallux valgus (acquired), left foot: Secondary | ICD-10-CM

## 2019-06-07 NOTE — Progress Notes (Signed)
   Subjective:  Patient presents today status post bunionectomy left. DOS: 05/26/2019. She states she is doing well. She reports some swelling of the 2nd digit. She has been taking Percocet, using ice therapy and elevating the foot for treatment. She has been using the CAM boot as directed. There are no aggravating factors noted. Patient is here for further evaluation and treatment.   Past Medical History:  Diagnosis Date  . Asthma   . Depression   . Hypertension       Objective/Physical Exam Neurovascular status intact.  Skin incisions appear to be well coapted with sutures and staples intact. No sign of infectious process noted. No dehiscence. No active bleeding noted. Moderate edema noted to the surgical extremity.  Radiographic Exam:  Orthopedic hardware and osteotomies sites appear to be stable with routine healing.  Assessment: 1. s/p bunionectomy left. DOS: 05/26/2019   Plan of Care:  1. Patient was evaluated. X-rays reviewed 2. Dressing changed. Keep clean, dry and intact for one week.  3. Continue using CAM boot. Weightbearing as tolerated with assistance of crutches.  4. Prescription for Vicodin 10/325 mg provided to patient.  5. Return to clinic in one week.    Edrick Kins, DPM [facility]  Dr. Edrick Kins, [address]                                        [place], [place] [address]                Office [phone]  Fax [phone]

## 2019-06-07 NOTE — Progress Notes (Signed)
She presents today date of surgery 05/26/2019 status post [facility] bunionectomy and a capsulotomy second metatarsal phalangeal joint all on the left foot.  States that it is just burning a small amount but all in all she is doing very well.  Objective: Vital signs are stable she is alert oriented x3.  Pulses are palpable.  Edema has gone down considerably on the left foot.  Margins are well coapted sutures are intact.  She has good range of motion of the first metatarsophalangeal joint both passively and actively.  Assessment: Well-healing surgical foot date of surgery 05/26/2019.  Plan: Placed her in a compression anklet and a Darco shoe I will follow-up with her in 2 weeks for another set of x-rays.

## 2019-06-08 ENCOUNTER — Ambulatory Visit: Payer: 59 | Admitting: Adult Health

## 2019-06-08 ENCOUNTER — Encounter: Payer: 59 | Admitting: Podiatry

## 2019-06-13 ENCOUNTER — Telehealth: Payer: Self-pay

## 2019-06-13 NOTE — Telephone Encounter (Signed)
CONFIRMED AND SCREENED FOR 06-15-19 OV. 

## 2019-06-15 ENCOUNTER — Ambulatory Visit: Payer: 59 | Admitting: Nurse Practitioner

## 2019-06-15 ENCOUNTER — Encounter: Payer: Self-pay | Admitting: Nurse Practitioner

## 2019-06-15 ENCOUNTER — Other Ambulatory Visit: Payer: Self-pay

## 2019-06-15 VITALS — BP 141/79 | HR 68 | Temp 97.4°F | Resp 16 | Ht 60.0 in | Wt 162.0 lb

## 2019-06-15 DIAGNOSIS — Z6827 Body mass index (BMI) 27.0-27.9, adult: Secondary | ICD-10-CM | POA: Diagnosis not present

## 2019-06-15 DIAGNOSIS — Z13820 Encounter for screening for osteoporosis: Secondary | ICD-10-CM | POA: Diagnosis not present

## 2019-06-15 DIAGNOSIS — L03116 Cellulitis of left lower limb: Secondary | ICD-10-CM

## 2019-06-15 DIAGNOSIS — I10 Essential (primary) hypertension: Secondary | ICD-10-CM

## 2019-06-15 DIAGNOSIS — Z1231 Encounter for screening mammogram for malignant neoplasm of breast: Secondary | ICD-10-CM | POA: Diagnosis not present

## 2019-06-15 DIAGNOSIS — G43909 Migraine, unspecified, not intractable, without status migrainosus: Secondary | ICD-10-CM

## 2019-06-15 MED ORDER — SULFAMETHOXAZOLE-TRIMETHOPRIM 800-160 MG PO TABS: 1.0000 | ORAL_TABLET | Freq: Two times a day (BID) | ORAL | 0 refills | Status: DC

## 2019-06-15 MED ORDER — PHENTERMINE HCL 37.5 MG PO TABS: 37.5000 mg | ORAL_TABLET | Freq: Every day | ORAL | 1 refills | Status: DC
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
## 2019-06-18 DIAGNOSIS — Z13820 Encounter for screening for osteoporosis: Secondary | ICD-10-CM | POA: Insufficient documentation

## 2019-06-18 DIAGNOSIS — L03116 Cellulitis of left lower limb: Secondary | ICD-10-CM | POA: Insufficient documentation

## 2019-06-21 ENCOUNTER — Other Ambulatory Visit: Payer: 59

## 2019-06-21 NOTE — Progress Notes (Signed)
This encounter was created in error - please disregard.

## 2019-06-22 ENCOUNTER — Encounter: Payer: 59 | Admitting: Podiatry

## 2019-06-26 ENCOUNTER — Encounter: Payer: Self-pay | Admitting: Podiatry

## 2019-06-26 ENCOUNTER — Ambulatory Visit (INDEPENDENT_AMBULATORY_CARE_PROVIDER_SITE_OTHER): Payer: 59 | Admitting: Podiatry

## 2019-06-26 ENCOUNTER — Ambulatory Visit (INDEPENDENT_AMBULATORY_CARE_PROVIDER_SITE_OTHER): Payer: 59

## 2019-06-26 ENCOUNTER — Other Ambulatory Visit: Payer: Self-pay

## 2019-06-26 DIAGNOSIS — M2012 Hallux valgus (acquired), left foot: Secondary | ICD-10-CM

## 2019-06-26 DIAGNOSIS — Z9889 Other specified postprocedural states: Secondary | ICD-10-CM
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
## 2019-07-05 ENCOUNTER — Other Ambulatory Visit: Payer: 59

## 2019-07-10 ENCOUNTER — Ambulatory Visit (INDEPENDENT_AMBULATORY_CARE_PROVIDER_SITE_OTHER): Payer: 59

## 2019-07-10 ENCOUNTER — Ambulatory Visit (INDEPENDENT_AMBULATORY_CARE_PROVIDER_SITE_OTHER): Payer: Self-pay | Admitting: Podiatry

## 2019-07-10 ENCOUNTER — Other Ambulatory Visit: Payer: Self-pay

## 2019-07-10 ENCOUNTER — Encounter: Payer: Self-pay | Admitting: Podiatry

## 2019-07-10 DIAGNOSIS — M2012 Hallux valgus (acquired), left foot: Secondary | ICD-10-CM

## 2019-07-10 DIAGNOSIS — Z9889 Other specified postprocedural states: Secondary | ICD-10-CM

## 2019-07-10 NOTE — Progress Notes (Signed)
She presents today for postop visit date of surgery 05/26/2019 [facility] bunionectomy.  She states that she is doing fine she states that I am in a regular tennis shoe and is not very sore.  Objective: Vital signs are stable she is alert oriented x3 mild edema to the first intermetatarsal space of the left foot great range of motion of the first metatarsophalangeal joint no cellulitis drainage or odor no open lesions or wounds.  Radiographs taken today demonstrate cerclage wire at the capital osteotomy and a screw fixation.  Appears to be healing very nicely.  Assessment: Well-healing surgical foot left x6 weeks.  Plan: Follow-up with me in 1 month allow her to get back to her work.

## 2019-07-17 ENCOUNTER — Other Ambulatory Visit: Payer: Self-pay

## 2019-07-17 DIAGNOSIS — M5440 Lumbago with sciatica, unspecified side: Secondary | ICD-10-CM

## 2019-07-17 DIAGNOSIS — G8929 Other chronic pain: Secondary | ICD-10-CM

## 2019-07-17 MED ORDER — CYCLOBENZAPRINE HCL 10 MG PO TABS: ORAL_TABLET | ORAL | 0 refills | Status: DC

## 2019-07-20 ENCOUNTER — Ambulatory Visit
Admission: RE | Admit: 2019-07-20 | Discharge: 2019-07-20 | Disposition: A | Discharge status: Home / Self Care | Payer: 59 | Source: Ambulatory Visit | Attending: Nurse Practitioner | Admitting: Nurse Practitioner

## 2019-07-20 DIAGNOSIS — Z13820 Encounter for screening for osteoporosis: Secondary | ICD-10-CM

## 2019-07-20 DIAGNOSIS — Z1231 Encounter for screening mammogram for malignant neoplasm of breast: Secondary | ICD-10-CM | POA: Diagnosis present

## 2019-07-20 NOTE — Progress Notes (Signed)
Normal bone density

## 2019-07-20 NOTE — Progress Notes (Signed)
Negative mammogram

## 2019-07-21 ENCOUNTER — Encounter: Payer: Self-pay | Admitting: Nurse Practitioner

## 2019-07-24 ENCOUNTER — Ambulatory Visit: Payer: Managed Care, Other (non HMO) | Attending: Internal Medicine

## 2019-07-25 ENCOUNTER — Telehealth: Payer: Self-pay

## 2019-07-25 NOTE — Telephone Encounter (Signed)
LMOM FOR PATIENT TO CONFIRM 07-27-19 [facility] VIRTUALLY OR IN OFFICE PENDING THE WEATHER.

## 2019-07-26 ENCOUNTER — Telehealth: Payer: Self-pay

## 2019-07-26 NOTE — Telephone Encounter (Signed)
CONFIRMED 07-27-19 OV AS VIRTUAL.

## 2019-07-27 ENCOUNTER — Ambulatory Visit (INDEPENDENT_AMBULATORY_CARE_PROVIDER_SITE_OTHER): Payer: 59 | Admitting: Nurse Practitioner

## 2019-07-27 ENCOUNTER — Encounter: Payer: Self-pay | Admitting: Nurse Practitioner

## 2019-07-27 ENCOUNTER — Other Ambulatory Visit: Payer: Self-pay

## 2019-07-27 VITALS — Ht 60.0 in | Wt 156.0 lb

## 2019-07-27 DIAGNOSIS — Z6830 Body mass index (BMI) 30.0-30.9, adult: Secondary | ICD-10-CM | POA: Diagnosis not present

## 2019-07-27 DIAGNOSIS — I10 Essential (primary) hypertension: Secondary | ICD-10-CM

## 2019-07-27 MED ORDER — PHENTERMINE HCL 37.5 MG PO TABS: 37.5000 mg | ORAL_TABLET | Freq: Every day | ORAL | 1 refills | Status: DC
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
## 2019-08-23 ENCOUNTER — Other Ambulatory Visit: Payer: Self-pay

## 2019-08-23 ENCOUNTER — Ambulatory Visit (INDEPENDENT_AMBULATORY_CARE_PROVIDER_SITE_OTHER): Payer: 59

## 2019-08-23 ENCOUNTER — Ambulatory Visit (INDEPENDENT_AMBULATORY_CARE_PROVIDER_SITE_OTHER): Payer: 59 | Admitting: Podiatry

## 2019-08-23 ENCOUNTER — Encounter: Payer: Self-pay | Admitting: Podiatry

## 2019-08-23 VITALS — Temp 98.1°F

## 2019-08-23 DIAGNOSIS — M2012 Hallux valgus (acquired), left foot: Secondary | ICD-10-CM

## 2019-08-23 DIAGNOSIS — Z9889 Other specified postprocedural states: Secondary | ICD-10-CM

## 2019-08-23 NOTE — Progress Notes (Signed)
She presents today date of surgery 05/26/2019 status post [facility] bunionectomy left foot.  She states that she is doing very well sometimes to have a little sharp pain but for the most part I am doing what I need to do and I can continue to work.  Objective: Vital signs are stable alert and oriented x3.  Pulses are palpable.  Neurologic sensorium is intact deep tendon flexes are intact muscle strength is normal symmetrical bilateral.  She has great range of motion of the first metatarsophalangeal joint dorsiflexion and plantarflexion mild edema no cellulitis drainage or odor associated with it.  Radiographs demonstrate well healing osteotomy with internal fixation intact and does not appear to be loose.  Assessment: Well-healing surgical foot.  Plan: Follow-up with me on an as-needed basis.

## 2019-08-28 ENCOUNTER — Other Ambulatory Visit: Payer: Self-pay

## 2019-08-28 DIAGNOSIS — K219 Gastro-esophageal reflux disease without esophagitis: Secondary | ICD-10-CM

## 2019-08-28 MED ORDER — PANTOPRAZOLE SODIUM 20 MG PO TBEC: 20.0000 mg | DELAYED_RELEASE_TABLET | Freq: Every day | ORAL | 4 refills | Status: DC

## 2019-09-05 ENCOUNTER — Telehealth: Payer: Self-pay

## 2019-09-05 NOTE — Telephone Encounter (Signed)
CONFIRMED AND SCREENED FOR 09-07-19 OV. 

## 2019-09-07 ENCOUNTER — Ambulatory Visit (INDEPENDENT_AMBULATORY_CARE_PROVIDER_SITE_OTHER): Payer: 59 | Admitting: Nurse Practitioner

## 2019-09-07 ENCOUNTER — Other Ambulatory Visit: Payer: Self-pay

## 2019-09-07 ENCOUNTER — Encounter: Payer: Self-pay | Admitting: Nurse Practitioner

## 2019-09-07 VITALS — BP 135/70 | HR 65 | Temp 97.2°F | Resp 16 | Ht 60.0 in | Wt 163.4 lb

## 2019-09-07 DIAGNOSIS — Z6831 Body mass index (BMI) 31.0-31.9, adult: Secondary | ICD-10-CM | POA: Diagnosis not present

## 2019-09-07 DIAGNOSIS — I10 Essential (primary) hypertension: Secondary | ICD-10-CM

## 2019-09-07 DIAGNOSIS — J4520 Mild intermittent asthma, uncomplicated: Secondary | ICD-10-CM | POA: Diagnosis not present

## 2019-09-07 MED ORDER — PHENTERMINE HCL 37.5 MG PO TABS: 37.5000 mg | ORAL_TABLET | Freq: Every day | ORAL | 1 refills | Status: DC
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
## 2019-09-12 ENCOUNTER — Encounter: Payer: Self-pay | Admitting: Podiatry

## 2019-09-21 DIAGNOSIS — Z6831 Body mass index (BMI) 31.0-31.9, adult: Secondary | ICD-10-CM | POA: Insufficient documentation

## 2019-09-21 DIAGNOSIS — Z6830 Body mass index (BMI) 30.0-30.9, adult: Secondary | ICD-10-CM | POA: Insufficient documentation

## 2019-10-16 ENCOUNTER — Other Ambulatory Visit: Payer: Self-pay

## 2019-10-16 DIAGNOSIS — G8929 Other chronic pain: Secondary | ICD-10-CM

## 2019-10-16 DIAGNOSIS — M5440 Lumbago with sciatica, unspecified side: Secondary | ICD-10-CM

## 2019-10-16 MED ORDER — CYCLOBENZAPRINE HCL 10 MG PO TABS: ORAL_TABLET | ORAL | 0 refills | Status: DC

## 2019-10-17 ENCOUNTER — Other Ambulatory Visit: Payer: Self-pay

## 2019-10-17 ENCOUNTER — Telehealth: Payer: Self-pay

## 2019-10-17 DIAGNOSIS — J4520 Mild intermittent asthma, uncomplicated: Secondary | ICD-10-CM

## 2019-10-17 MED ORDER — ALBUTEROL SULFATE HFA 108 (90 BASE) MCG/ACT IN AERS: 2.0000 | INHALATION_SPRAY | Freq: Four times a day (QID) | RESPIRATORY_TRACT | 5 refills | Status: DC | PRN

## 2019-10-17 NOTE — Telephone Encounter (Signed)
Lmom to confirm and screen for 10-19-19 ov.

## 2019-10-19 ENCOUNTER — Telehealth: Payer: Self-pay

## 2019-10-19 ENCOUNTER — Ambulatory Visit: Payer: 59 | Admitting: Nurse Practitioner

## 2019-10-19 ENCOUNTER — Other Ambulatory Visit: Payer: Self-pay

## 2019-10-19 ENCOUNTER — Encounter: Payer: Self-pay | Admitting: Nurse Practitioner

## 2019-10-19 VITALS — BP 140/89 | HR 70 | Temp 97.2°F | Resp 16 | Ht 60.0 in | Wt 164.0 lb

## 2019-10-19 DIAGNOSIS — Z6832 Body mass index (BMI) 32.0-32.9, adult: Secondary | ICD-10-CM | POA: Diagnosis not present

## 2019-10-19 DIAGNOSIS — J4520 Mild intermittent asthma, uncomplicated: Secondary | ICD-10-CM | POA: Diagnosis not present

## 2019-10-19 DIAGNOSIS — I10 Essential (primary) hypertension: Secondary | ICD-10-CM | POA: Diagnosis not present

## 2019-10-19 MED ORDER — PHENTERMINE HCL 37.5 MG PO TABS: 37.5000 mg | ORAL_TABLET | Freq: Every day | ORAL | 1 refills | Status: DC

## 2019-10-19 NOTE — Telephone Encounter (Signed)
Confirmed appointment on 10/19/2019 and screened for covid. klh 
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
## 2019-11-20 ENCOUNTER — Other Ambulatory Visit: Payer: Self-pay

## 2019-11-20 ENCOUNTER — Other Ambulatory Visit: Payer: Self-pay | Admitting: Nurse Practitioner

## 2019-11-20 ENCOUNTER — Telehealth: Payer: Self-pay

## 2019-11-20 DIAGNOSIS — M064 Inflammatory polyarthropathy: Secondary | ICD-10-CM

## 2019-11-20 MED ORDER — TRAMADOL HCL 50 MG PO TABS: 50.0000 mg | ORAL_TABLET | Freq: Three times a day (TID) | ORAL | 3 refills | Status: DC | PRN

## 2019-11-20 NOTE — Progress Notes (Signed)
Resent tramadol prescription to [facility]

## 2019-11-20 NOTE — Telephone Encounter (Signed)
Resent tramadol prescription to [facility]

## 2019-11-30 ENCOUNTER — Ambulatory Visit: Payer: 59 | Admitting: Nurse Practitioner

## 2019-12-07 ENCOUNTER — Ambulatory Visit: Payer: 59 | Admitting: Nurse Practitioner

## 2019-12-13 ENCOUNTER — Telehealth: Payer: Self-pay

## 2019-12-13 NOTE — Telephone Encounter (Signed)
Lmom to confirm and screen for 12-15-19 ov. °

## 2019-12-13 NOTE — Telephone Encounter (Signed)
Confirmed appointment on 12/15/2019 and screened for covid. [facility]

## 2019-12-14 ENCOUNTER — Other Ambulatory Visit: Payer: Self-pay

## 2019-12-14 ENCOUNTER — Ambulatory Visit (INDEPENDENT_AMBULATORY_CARE_PROVIDER_SITE_OTHER): Payer: 59 | Admitting: Nurse Practitioner

## 2019-12-14 ENCOUNTER — Encounter: Payer: Self-pay | Admitting: Nurse Practitioner

## 2019-12-14 VITALS — BP 120/82 | HR 69 | Resp 16 | Ht 60.0 in | Wt 161.2 lb

## 2019-12-14 DIAGNOSIS — B370 Candidal stomatitis: Secondary | ICD-10-CM | POA: Diagnosis not present

## 2019-12-14 DIAGNOSIS — J4520 Mild intermittent asthma, uncomplicated: Secondary | ICD-10-CM

## 2019-12-14 DIAGNOSIS — I10 Essential (primary) hypertension: Secondary | ICD-10-CM

## 2019-12-14 DIAGNOSIS — G43909 Migraine, unspecified, not intractable, without status migrainosus: Secondary | ICD-10-CM

## 2019-12-14 DIAGNOSIS — K582 Mixed irritable bowel syndrome: Secondary | ICD-10-CM | POA: Diagnosis not present

## 2019-12-14 DIAGNOSIS — Z6831 Body mass index (BMI) 31.0-31.9, adult: Secondary | ICD-10-CM

## 2019-12-14 DIAGNOSIS — J302 Other seasonal allergic rhinitis: Secondary | ICD-10-CM

## 2019-12-14 DIAGNOSIS — J3089 Other allergic rhinitis: Secondary | ICD-10-CM | POA: Diagnosis not present

## 2019-12-14 MED ORDER — CLOTRIMAZOLE 10 MG MT TROC: 10.0000 mg | Freq: Every day | OROMUCOSAL | 1 refills | Status: DC

## 2019-12-14 MED ORDER — RIZATRIPTAN BENZOATE 10 MG PO TABS: 10.0000 mg | ORAL_TABLET | ORAL | 5 refills | Status: DC | PRN

## 2019-12-14 MED ORDER — FLUTICASONE-SALMETEROL 115-21 MCG/ACT IN AERO: 2.0000 | INHALATION_SPRAY | Freq: Two times a day (BID) | RESPIRATORY_TRACT | 3 refills | Status: DC | PRN

## 2019-12-14 MED ORDER — MONTELUKAST SODIUM 10 MG PO TABS: 10.0000 mg | ORAL_TABLET | Freq: Every day | ORAL | 3 refills | Status: DC

## 2019-12-14 MED ORDER — PROPRANOLOL HCL ER 120 MG PO CP24: 120.0000 mg | ORAL_CAPSULE | Freq: Every day | ORAL | 1 refills | Status: DC

## 2019-12-14 MED ORDER — MOMETASONE FUROATE 50 MCG/ACT NA SUSP: 2.0000 | Freq: Every day | NASAL | 3 refills | Status: DC

## 2019-12-14 MED ORDER — PHENTERMINE HCL 37.5 MG PO TABS: 37.5000 mg | ORAL_TABLET | Freq: Every day | ORAL | 2 refills | Status: DC

## 2019-12-14 MED ORDER — VERAPAMIL HCL 40 MG PO TABS: ORAL_TABLET | ORAL | 3 refills | Status: DC
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
## 2019-12-15 ENCOUNTER — Ambulatory Visit: Payer: 59 | Admitting: Nurse Practitioner

## 2019-12-22 ENCOUNTER — Encounter: Payer: Self-pay | Admitting: *Deleted

## 2020-01-01 ENCOUNTER — Other Ambulatory Visit: Payer: Self-pay

## 2020-01-01 DIAGNOSIS — B370 Candidal stomatitis: Secondary | ICD-10-CM

## 2020-01-01 MED ORDER — CLOTRIMAZOLE 10 MG MT TROC: 10.0000 mg | Freq: Every day | OROMUCOSAL | 1 refills | Status: AC

## 2020-01-12 ENCOUNTER — Other Ambulatory Visit: Payer: Self-pay

## 2020-01-12 DIAGNOSIS — M5440 Lumbago with sciatica, unspecified side: Secondary | ICD-10-CM

## 2020-01-12 DIAGNOSIS — G8929 Other chronic pain: Secondary | ICD-10-CM

## 2020-01-12 MED ORDER — CYCLOBENZAPRINE HCL 10 MG PO TABS: ORAL_TABLET | ORAL | 0 refills | Status: DC

## 2020-01-31 ENCOUNTER — Telehealth: Payer: Self-pay

## 2020-01-31 NOTE — Telephone Encounter (Signed)
Faxed Demographic sheet and last ov note and medication list to [facility] case Freight forwarder with [employer]. Request held at front desk.

## 2020-02-07 ENCOUNTER — Other Ambulatory Visit: Payer: Self-pay

## 2020-02-07 ENCOUNTER — Other Ambulatory Visit
Admission: RE | Admit: 2020-02-07 | Discharge: 2020-02-07 | Disposition: A | Discharge status: Home / Self Care | Payer: 59 | Source: Ambulatory Visit | Attending: Gastroenterology | Admitting: Gastroenterology

## 2020-02-07 DIAGNOSIS — Z01812 Encounter for preprocedural laboratory examination: Secondary | ICD-10-CM | POA: Insufficient documentation

## 2020-02-07 DIAGNOSIS — Z20822 Contact with and (suspected) exposure to covid-19: Secondary | ICD-10-CM | POA: Diagnosis not present

## 2020-02-07 LAB — SARS CORONAVIRUS 2 (TAT 6-24 HRS): SARS Coronavirus 2: NEGATIVE

## 2020-02-09 ENCOUNTER — Ambulatory Visit: Payer: 59 | Admitting: Registered Nurse

## 2020-02-09 ENCOUNTER — Encounter
Admission: RE | Disposition: A | Discharge status: Home / Self Care | Payer: Self-pay | Source: Home / Self Care | Attending: Gastroenterology

## 2020-02-09 ENCOUNTER — Ambulatory Visit
Admission: RE | Admit: 2020-02-09 | Discharge: 2020-02-09 | Disposition: A | Discharge status: Home / Self Care | Payer: 59 | Attending: Gastroenterology | Admitting: Gastroenterology

## 2020-02-09 DIAGNOSIS — K319 Disease of stomach and duodenum, unspecified: Secondary | ICD-10-CM | POA: Insufficient documentation

## 2020-02-09 DIAGNOSIS — D125 Benign neoplasm of sigmoid colon: Secondary | ICD-10-CM | POA: Insufficient documentation

## 2020-02-09 DIAGNOSIS — K2980 Duodenitis without bleeding: Secondary | ICD-10-CM | POA: Diagnosis not present

## 2020-02-09 DIAGNOSIS — I252 Old myocardial infarction: Secondary | ICD-10-CM | POA: Diagnosis not present

## 2020-02-09 DIAGNOSIS — I10 Essential (primary) hypertension: Secondary | ICD-10-CM | POA: Insufficient documentation

## 2020-02-09 DIAGNOSIS — R197 Diarrhea, unspecified: Secondary | ICD-10-CM | POA: Insufficient documentation

## 2020-02-09 DIAGNOSIS — K449 Diaphragmatic hernia without obstruction or gangrene: Secondary | ICD-10-CM | POA: Insufficient documentation

## 2020-02-09 DIAGNOSIS — Z9884 Bariatric surgery status: Secondary | ICD-10-CM | POA: Insufficient documentation

## 2020-02-09 DIAGNOSIS — F329 Major depressive disorder, single episode, unspecified: Secondary | ICD-10-CM | POA: Insufficient documentation

## 2020-02-09 DIAGNOSIS — J45909 Unspecified asthma, uncomplicated: Secondary | ICD-10-CM | POA: Insufficient documentation

## 2020-02-09 DIAGNOSIS — L83 Acanthosis nigricans: Secondary | ICD-10-CM | POA: Diagnosis not present

## 2020-02-09 DIAGNOSIS — Z79899 Other long term (current) drug therapy: Secondary | ICD-10-CM | POA: Diagnosis not present

## 2020-02-09 DIAGNOSIS — R1013 Epigastric pain: Secondary | ICD-10-CM | POA: Insufficient documentation

## 2020-02-09 DIAGNOSIS — R1310 Dysphagia, unspecified: Secondary | ICD-10-CM | POA: Insufficient documentation

## 2020-02-09 DIAGNOSIS — Z7951 Long term (current) use of inhaled steroids: Secondary | ICD-10-CM | POA: Diagnosis not present

## 2020-02-09 HISTORY — PX: COLONOSCOPY WITH PROPOFOL: SHX5780

## 2020-02-09 HISTORY — PX: ESOPHAGOGASTRODUODENOSCOPY (EGD) WITH PROPOFOL: SHX5813

## 2020-02-09 SURGERY — COLONOSCOPY WITH PROPOFOL
Anesthesia: General

## 2020-02-09 MED ORDER — PHENYLEPHRINE HCL (PRESSORS) 10 MG/ML IV SOLN
INTRAVENOUS | Status: DC | PRN
  Administered 2020-02-09: 100 ug via INTRAVENOUS

## 2020-02-09 MED ORDER — LIDOCAINE HCL (CARDIAC) PF 100 MG/5ML IV SOSY
PREFILLED_SYRINGE | INTRAVENOUS | Status: DC | PRN
  Administered 2020-02-09: 100 mg via INTRAVENOUS

## 2020-02-09 MED ORDER — PROPOFOL 10 MG/ML IV BOLUS
INTRAVENOUS | Status: DC | PRN
  Administered 2020-02-09: 40 mg via INTRAVENOUS
  Administered 2020-02-09: 50 mg via INTRAVENOUS
  Administered 2020-02-09 (×3): 40 mg via INTRAVENOUS
  Administered 2020-02-09: 130 mg via INTRAVENOUS
  Administered 2020-02-09 (×2): 40 mg via INTRAVENOUS
  Administered 2020-02-09: 60 mg via INTRAVENOUS
  Administered 2020-02-09: 80 mg via INTRAVENOUS
  Administered 2020-02-09: 40 mg via INTRAVENOUS

## 2020-02-09 MED ORDER — SODIUM CHLORIDE 0.9 % IV SOLN
INTRAVENOUS | Status: DC
  Administered 2020-02-09: 20 mL/h via INTRAVENOUS

## 2020-02-09 MED ORDER — LIDOCAINE HCL (PF) 2 % IJ SOLN
INTRAMUSCULAR | Status: AC
  Filled 2020-02-09: qty 5

## 2020-02-09 MED ORDER — PROPOFOL 10 MG/ML IV BOLUS
INTRAVENOUS | Status: AC
  Filled 2020-02-09: qty 20

## 2020-02-09 NOTE — Interval H&P Note (Signed)
History and Physical Interval Note:  02/09/2020 12:58 PM  [name] [name]  has presented today for surgery, with the diagnosis of Diarrhea.  The various methods of treatment have been discussed with the patient and family. After consideration of risks, benefits and other options for treatment, the patient has consented to  Procedure(s): COLONOSCOPY WITH PROPOFOL (N/A) ESOPHAGOGASTRODUODENOSCOPY (EGD) WITH PROPOFOL (N/A) as a surgical intervention.  The patient's history has been reviewed, patient examined, no change in status, stable for surgery.  I have reviewed the patient's chart and labs.  Questions were answered to the patient's satisfaction.     Lesly Rubenstein  Ok to proceed with EGD/Colonoscopy

## 2020-02-09 NOTE — Anesthesia Postprocedure Evaluation (Signed)
Anesthesia Post Note  Patient: Herbalist  Procedure(s) Performed: COLONOSCOPY WITH PROPOFOL (N/A ) ESOPHAGOGASTRODUODENOSCOPY (EGD) WITH PROPOFOL (N/A )  Patient location during evaluation: Endoscopy Anesthesia Type: General Level of consciousness: awake and alert Pain management: pain level controlled Vital Signs Assessment: post-procedure vital signs reviewed and stable Respiratory status: spontaneous breathing and respiratory function stable Cardiovascular status: stable Anesthetic complications: no   No complications documented.   Last Vitals:  Vitals:   02/09/20 1402 02/09/20 1412  BP: (!) 153/98 (!) 120/108  Pulse: 71 67  Resp: 17 (!) 24  Temp:    SpO2: 100% 100%    Last Pain:  Vitals:   02/09/20 1402  TempSrc:   PainSc: 0-No pain                 Aran Menning K

## 2020-02-09 NOTE — Transfer of Care (Signed)
Immediate Anesthesia Transfer of Care Note  Patient: [name] [name]  Procedure(s) Performed: COLONOSCOPY WITH PROPOFOL (N/A ) ESOPHAGOGASTRODUODENOSCOPY (EGD) WITH PROPOFOL (N/A )  Patient Location: Endoscopy Unit  Anesthesia Type:General  Level of Consciousness: awake, alert  and oriented  Airway & Oxygen Therapy: Patient Spontanous Breathing  Post-op Assessment: Report given to RN and Post -op Vital signs reviewed and stable  Post vital signs: Reviewed and stable  Last Vitals:  Vitals Value Taken Time  BP 134/79 02/09/20 1352  Temp 36.1 C 02/09/20 1352  Pulse 75 02/09/20 1353  Resp 17 02/09/20 1353  SpO2 100 % 02/09/20 1353  Vitals shown include unvalidated device data.  Last Pain:  Vitals:   02/09/20 1352  TempSrc: Temporal  PainSc: 0-No pain         Complications: No complications documented.

## 2020-02-09 NOTE — H&P (Signed)
Outpatient short stay form Pre-procedure 02/09/2020 12:55 PM [name] Miyamoto MD, MPH  Primary Physician:  Dr. Humphrey Rolls  Reason for visit:  Diarrhea  History of present illness:   59 y/o lady with history of multiple GI problems and surgeries including bariatric surgery here for EGD/Colonoscopy for evaluation of diarrhea. No blood thinners. Also occasional dysphagia.    Current Facility-Administered Medications:  .  0.9 %  sodium chloride infusion, , Intravenous, Continuous, Quinnetta Roepke, [name] Cork, MD, Last Rate: 20 mL/hr at 02/09/20 1235, Continued from Pre-op at 02/09/20 1235  Medications Prior to Admission  Medication Sig Dispense Refill Last Dose  . albuterol (VENTOLIN HFA) 108 (90 Base) MCG/ACT inhaler Inhale 2 puffs into the lungs every 6 (six) hours as needed. 18 g 5 02/08/2020 at Unknown time  . ALPRAZolam (XANAX) 0.5 MG tablet TK 1 T PO TID  1 02/09/2020 at 0600  . ARIPiprazole (ABILIFY) 10 MG tablet    02/09/2020 at 0600  . busPIRone (BUSPAR) 10 MG tablet TK 1 T PO BID  6 02/09/2020 at 0600  . cetirizine (ZYRTEC) 10 MG tablet Take 1 tablet (10 mg total) by mouth daily. 90 tablet 4 02/09/2020 at 0600  . clotrimazole (MYCELEX) 10 MG troche Take 1 tablet (10 mg total) by mouth 5 (five) times daily. 50 tablet 1 Past Month at 0600  . cyclobenzaprine (FLEXERIL) 10 MG tablet TAKE 1 TABLET(10 MG) BY MOUTH THREE TIMES DAILY AS NEEDED FOR MUSCLE SPASMS 270 tablet 0 Past Week at Unknown time  . fluticasone-salmeterol (ADVAIR HFA) 115-21 MCG/ACT inhaler Inhale 2 puffs into the lungs 2 (two) times daily as needed. 3 Inhaler 3 Past Month at Unknown time  . gabapentin (NEURONTIN) 600 MG tablet    02/08/2020 at Unknown time  . mometasone (NASONEX) 50 MCG/ACT nasal spray Place 2 sprays into the nose daily. 41 g 3 02/09/2020 at 0600  . montelukast (SINGULAIR) 10 MG tablet Take 1 tablet (10 mg total) by mouth at bedtime. 90 tablet 3 02/09/2020 at 0600  . pantoprazole (PROTONIX) 20 MG tablet Take 1 tablet (20 mg total) by  mouth daily. 90 tablet 4 02/09/2020 at 0600  . prazosin (MINIPRESS) 1 MG capsule Take 1 capsule po QD and 2 capsules po QPM 90 capsule 3 02/08/2020 at Unknown time  . propranolol ER (INDERAL LA) 120 MG 24 hr capsule Take 1 capsule (120 mg total) by mouth at bedtime. 90 capsule 1 02/08/2020 at Unknown time  . QUEtiapine (SEROQUEL) 100 MG tablet TK 1 T PO QHS  6 02/08/2020 at Unknown time  . rizatriptan (MAXALT) 10 MG tablet Take 1 tablet (10 mg total) by mouth as needed for migraine. May repeat in 2 hours if needed 30 tablet 5 02/09/2020 at 0700  . traMADol (ULTRAM) 50 MG tablet Take 1 tablet (50 mg total) by mouth 3 (three) times daily as needed. 90 tablet 3 Past Week at Unknown time  . verapamil (CALAN) 40 MG tablet Take 1 tab po daily 90 tablet 3 02/09/2020 at 0600  . VITAMIN D, ERGOCALCIFEROL, PO Take by mouth. TAKE ONE TABLET TWICE PER WEEK   Past Week at Unknown time  . phentermine (ADIPEX-P) 37.5 MG tablet Take 1 tablet (37.5 mg total) by mouth daily before breakfast. (Patient not taking: Reported on 02/09/2020) 30 tablet 2 Completed Course at Unknown time     No Known Allergies   Past Medical History:  Diagnosis Date  . Asthma   . Depression   . Hypertension  Review of systems:  Otherwise negative.    Physical Exam  Gen: Alert, oriented. Appears stated age.  HEENT: [place]/AT. PERRLA. Lungs: No respiratory distress Abd: soft, benign, no masses Ext: No edema    Planned procedures: Proceed with EGD/colonoscopy. The patient understands the nature of the planned procedure, indications, risks, alternatives and potential complications including but not limited to bleeding, infection, perforation, damage to internal organs and possible oversedation/side effects from anesthesia. The patient agrees and gives consent to proceed.  Please refer to procedure notes for findings, recommendations and patient disposition/instructions.     [name] Miyamoto MD, MPH Gastroenterology 02/09/2020  12:55  PM

## 2020-02-09 NOTE — Anesthesia Preprocedure Evaluation (Signed)
Anesthesia Evaluation  Patient identified by MRN, date of birth, ID band Patient awake    Reviewed: Allergy & Precautions, NPO status , Patient's Chart, lab work & pertinent test results  History of Anesthesia Complications Negative for: history of anesthetic complications  Airway Mallampati: II       Dental   Pulmonary asthma , neg sleep apnea, Not current smoker,           Cardiovascular hypertension, Pt. on medications (-) Past MI and (-) CHF (-) dysrhythmias (-) Valvular Problems/Murmurs     Neuro/Psych neg Seizures Anxiety Depression    GI/Hepatic Neg liver ROS, hiatal hernia, GERD  Medicated,  Endo/Other  neg diabetes  Renal/GU negative Renal ROS     Musculoskeletal   Abdominal   Peds  Hematology   Anesthesia Other Findings   Reproductive/Obstetrics                             Anesthesia Physical Anesthesia Plan  ASA: III  Anesthesia Plan: General   Post-op Pain Management:    Induction: Intravenous  PONV Risk Score and Plan: 3 and Propofol infusion, TIVA and Treatment may vary due to age or medical condition  Airway Management Planned: Nasal Cannula  Additional Equipment:   Intra-op Plan:   Post-operative Plan:   Informed Consent: I have reviewed the patients History and Physical, chart, labs and discussed the procedure including the risks, benefits and alternatives for the proposed anesthesia with the patient or authorized representative who has indicated his/her understanding and acceptance.       Plan Discussed with:   Anesthesia Plan Comments:         Anesthesia Quick Evaluation

## 2020-02-09 NOTE — Op Note (Signed)
[facility] Gastroenterology Patient Name: [name] [name] Procedure Date: 02/09/2020 12:56 PM MRN: [id number] Account #: [id number] Date of Birth: 15-Apr-1961 Admit Type: Outpatient Age: 59 Room: [facility] ENDO ROOM 3 Gender: Female Note Status: Finalized Procedure:             Colonoscopy Indications:           Clinically significant diarrhea of unexplained origin Providers:             Andrey Farmer MD, MD Referring MD:          Lavera Guise, MD (Referring MD) Medicines:             Monitored Anesthesia Care Complications:         No immediate complications. Procedure:             Pre-Anesthesia Assessment:                        - Prior to the procedure, a History and Physical was                         performed, and patient medications and allergies were                         reviewed. The patient is competent. The risks and                         benefits of the procedure and the sedation options and                         risks were discussed with the patient. All questions                         were answered and informed consent was obtained.                         Patient identification and proposed procedure were                         verified by the physician, the nurse, the anesthetist                         and the technician in the endoscopy suite. Mental                         Status Examination: alert and oriented. Airway                         Examination: normal oropharyngeal airway and neck                         mobility. Respiratory Examination: clear to                         auscultation. CV Examination: normal. Prophylactic                         Antibiotics: The patient does not require prophylactic  antibiotics. Prior Anticoagulants: The patient has                         taken no previous anticoagulant or antiplatelet                         agents. ASA Grade Assessment: II - A patient with mild                          systemic disease. After reviewing the risks and                         benefits, the patient was deemed in satisfactory                         condition to undergo the procedure. The anesthesia                         plan was to use monitored anesthesia care (MAC).                         Immediately prior to administration of medications,                         the patient was re-assessed for adequacy to receive                         sedatives. The heart rate, respiratory rate, oxygen                         saturations, blood pressure, adequacy of pulmonary                         ventilation, and response to care were monitored                         throughout the procedure. The physical status of the                         patient was re-assessed after the procedure.                        After obtaining informed consent, the colonoscope was                         passed under direct vision. Throughout the procedure,                         the patient's blood pressure, pulse, and oxygen                         saturations were monitored continuously. The                         Colonoscope was introduced through the anus and                         advanced to the the terminal ileum. The colonoscopy  was performed without difficulty. The patient                         tolerated the procedure well. The quality of the bowel                         preparation was adequate to identify polyps. Findings:      The perianal and digital rectal examinations were normal.      The terminal ileum appeared normal.      A 3 mm polyp was found in the sigmoid colon. The polyp was sessile. The       polyp was removed with a cold snare. Resection and retrieval were       complete. Estimated blood loss was minimal.      The colon (entire examined portion) appeared normal. Biopsies for       histology were taken with a cold forceps from the  descending colon for       evaluation of microscopic colitis. Estimated blood loss was minimal.      A few small-mouthed diverticula were found in the sigmoid colon.      Non-bleeding internal hemorrhoids were found during retroflexion. The       hemorrhoids were Grade I (internal hemorrhoids that do not prolapse).      The exam was otherwise without abnormality on direct and retroflexion       views. Impression:            - The examined portion of the ileum was normal.                        - One 3 mm polyp in the sigmoid colon, removed with a                         cold snare. Resected and retrieved.                        - The entire examined colon is normal. Biopsied.                        - Diverticulosis in the sigmoid colon.                        - Non-bleeding internal hemorrhoids.                        - The examination was otherwise normal on direct and                         retroflexion views. Recommendation:        - Discharge patient to home.                        - Resume previous diet.                        - Continue present medications.                        - Await pathology results.                        -  Repeat colonoscopy in 5 years for surveillance.                        - Return to referring physician as previously                         scheduled. Procedure Code(s):     --- Professional ---                        [phone], Colonoscopy, flexible; with removal of                         tumor(s), polyp(s), or other lesion(s) by snare                         technique                        45380, 25, Colonoscopy, flexible; with biopsy, single                         or multiple Diagnosis Code(s):     --- Professional ---                        K64.0, First degree hemorrhoids                        K63.5, Polyp of colon                        R19.7, Diarrhea, unspecified                        K57.30, Diverticulosis of large intestine without                          perforation or abscess without bleeding CPT copyright 2019 American Medical Association. All rights reserved. The codes documented in this report are preliminary and upon coder review may  be revised to meet current compliance requirements. Andrey Farmer, MD Andrey Farmer MD, MD 02/09/2020 1:59:46 PM Number of Addenda: 0 Note Initiated On: 02/09/2020 12:56 PM Scope Withdrawal Time: 0 hours 18 minutes 0 seconds  Total Procedure Duration: 0 hours 22 minutes 1 second  Estimated Blood Loss:  Estimated blood loss was minimal.      [facility]

## 2020-02-09 NOTE — Op Note (Signed)
[facility] Gastroenterology Patient Name: [name] [name] Procedure Date: 02/09/2020 12:57 PM MRN: [id number] Account #: [id number] Date of Birth: [birth year] Admit Type: Outpatient Age: 59 Room: [facility] ENDO ROOM 3 Gender: Female Note Status: Finalized Procedure:             Upper GI endoscopy Indications:           Dyspepsia, Diarrhea Providers:             Andrey Farmer MD, MD Referring MD:          Lavera Guise, MD (Referring MD) Medicines:             Monitored Anesthesia Care Complications:         No immediate complications. Estimated blood loss:                         Minimal. Procedure:             Pre-Anesthesia Assessment:                        - Prior to the procedure, a History and Physical was                         performed, and patient medications and allergies were                         reviewed. The patient is competent. The risks and                         benefits of the procedure and the sedation options and                         risks were discussed with the patient. All questions                         were answered and informed consent was obtained.                         Patient identification and proposed procedure were                         verified by the physician, the nurse, the anesthetist                         and the technician in the endoscopy suite. Mental                         Status Examination: alert and oriented. Airway                         Examination: normal oropharyngeal airway and neck                         mobility. Respiratory Examination: clear to                         auscultation. CV Examination: normal. Prophylactic  Antibiotics: The patient does not require prophylactic                         antibiotics. Prior Anticoagulants: The patient has                         taken no previous anticoagulant or antiplatelet                         agents. ASA Grade Assessment:  II - A patient with mild                         systemic disease. After reviewing the risks and                         benefits, the patient was deemed in satisfactory                         condition to undergo the procedure. The anesthesia                         plan was to use monitored anesthesia care (MAC).                         Immediately prior to administration of medications,                         the patient was re-assessed for adequacy to receive                         sedatives. The heart rate, respiratory rate, oxygen                         saturations, blood pressure, adequacy of pulmonary                         ventilation, and response to care were monitored                         throughout the procedure. The physical status of the                         patient was re-assessed after the procedure.                        After obtaining informed consent, the endoscope was                         passed under direct vision. Throughout the procedure,                         the patient's blood pressure, pulse, and oxygen                         saturations were monitored continuously. The Endoscope                         was introduced through the mouth, and advanced to the  second part of duodenum. The upper GI endoscopy was                         accomplished without difficulty. The patient tolerated                         the procedure well. Findings:      The examined esophagus was normal. Biopsies were obtained from the       proximal and distal esophagus with cold forceps for histology of       suspected eosinophilic esophagitis. Estimated blood loss was minimal.      A medium-sized hiatal hernia was present.      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      Localized mildly erythematous mucosa without bleeding was found in the       gastric antrum.      Lymphangiectasia was present in the second portion of the  duodenum. The       polyp was removed with a cold snare. Resection and retrieval were       complete. Estimated blood loss was minimal.      The exam of the duodenum was otherwise normal.      Biopsies for histology were taken with a cold forceps in the duodenal       bulb and in the second portion of the duodenum for evaluation of celiac       disease. Estimated blood loss was minimal. Impression:            - Normal esophagus. Biopsied.                        - Medium-sized hiatal hernia.                        - A sleeve gastrectomy was found.                        - Erythematous mucosa in the antrum.                        - Duodenal mucosal lymphangiectasia.                        - Biopsies were taken with a cold forceps for                         evaluation of celiac disease. Recommendation:        - Discharge patient to home.                        - Resume previous diet.                        - Continue present medications.                        - Await pathology results.                        - Return to referring physician as previously  scheduled. Procedure Code(s):     --- Professional ---                        [phone], Esophagogastroduodenoscopy, flexible,                         transoral; with removal of tumor(s), polyp(s), or                         other lesion(s) by snare technique Diagnosis Code(s):     --- Professional ---                        K44.9, Diaphragmatic hernia without obstruction or                         gangrene                        Z98.84, Bariatric surgery status                        K31.89, Other diseases of stomach and duodenum                        I89.0, Lymphedema, not elsewhere classified                        R10.13, Epigastric pain                        R19.7, Diarrhea, unspecified CPT copyright 2019 American Medical Association. All rights reserved. The codes documented in this report are preliminary  and upon coder review may  be revised to meet current compliance requirements. Andrey Farmer, MD Andrey Farmer MD, MD 02/09/2020 1:55:37 PM Number of Addenda: 0 Note Initiated On: 02/09/2020 12:57 PM Estimated Blood Loss:  Estimated blood loss was minimal.      [facility]

## 2020-02-11 ENCOUNTER — Encounter: Payer: Self-pay | Admitting: Gastroenterology

## 2020-02-16 LAB — SURGICAL PATHOLOGY

## 2020-02-19 ENCOUNTER — Ambulatory Visit: Payer: Managed Care, Other (non HMO) | Admitting: Gastroenterology

## 2020-02-26 ENCOUNTER — Encounter: Payer: Self-pay | Admitting: Internal Medicine

## 2020-02-26 ENCOUNTER — Ambulatory Visit (INDEPENDENT_AMBULATORY_CARE_PROVIDER_SITE_OTHER): Payer: 59 | Admitting: Internal Medicine

## 2020-02-26 ENCOUNTER — Other Ambulatory Visit: Payer: Self-pay | Admitting: Internal Medicine

## 2020-02-26 ENCOUNTER — Other Ambulatory Visit: Payer: Self-pay

## 2020-02-26 VITALS — BP 110/80 | HR 68 | Temp 97.2°F | Resp 16 | Ht 60.0 in | Wt 162.4 lb

## 2020-02-26 DIAGNOSIS — I10 Essential (primary) hypertension: Secondary | ICD-10-CM | POA: Diagnosis not present

## 2020-02-26 DIAGNOSIS — R300 Dysuria: Secondary | ICD-10-CM

## 2020-02-26 DIAGNOSIS — J4520 Mild intermittent asthma, uncomplicated: Secondary | ICD-10-CM

## 2020-02-26 DIAGNOSIS — R109 Unspecified abdominal pain: Secondary | ICD-10-CM | POA: Diagnosis not present

## 2020-02-26 DIAGNOSIS — R112 Nausea with vomiting, unspecified: Secondary | ICD-10-CM

## 2020-02-26 DIAGNOSIS — K565 Intestinal adhesions [bands], unspecified as to partial versus complete obstruction: Secondary | ICD-10-CM

## 2020-02-26 DIAGNOSIS — R1084 Generalized abdominal pain: Secondary | ICD-10-CM | POA: Diagnosis not present

## 2020-02-26 DIAGNOSIS — Z0001 Encounter for general adult medical examination with abnormal findings: Secondary | ICD-10-CM | POA: Diagnosis not present

## 2020-02-26 LAB — POCT URINALYSIS DIPSTICK
Bilirubin, UA: NEGATIVE
Blood, UA: NEGATIVE
Glucose, UA: NEGATIVE
Ketones, UA: NEGATIVE
Leukocytes, UA: NEGATIVE
Nitrite, UA: NEGATIVE
Protein, UA: POSITIVE — AB
Spec Grav, UA: 1.01 (ref 1.010–1.025)
Urobilinogen, UA: 0.2 E.U./dL
pH, UA: 6.5 (ref 5.0–8.0)
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
## 2020-02-27 ENCOUNTER — Other Ambulatory Visit: Payer: Self-pay

## 2020-02-27 ENCOUNTER — Ambulatory Visit
Admission: RE | Admit: 2020-02-27 | Discharge: 2020-02-27 | Disposition: A | Discharge status: Home / Self Care | Payer: 59 | Source: Ambulatory Visit | Attending: Internal Medicine | Admitting: Internal Medicine

## 2020-02-27 DIAGNOSIS — R112 Nausea with vomiting, unspecified: Secondary | ICD-10-CM | POA: Diagnosis present

## 2020-02-27 DIAGNOSIS — K565 Intestinal adhesions [bands], unspecified as to partial versus complete obstruction: Secondary | ICD-10-CM | POA: Insufficient documentation

## 2020-02-27 LAB — UA/M W/RFLX CULTURE, ROUTINE
Bilirubin, UA: NEGATIVE
Glucose, UA: NEGATIVE
Ketones, UA: NEGATIVE
Leukocytes,UA: NEGATIVE
Nitrite, UA: NEGATIVE
Protein,UA: NEGATIVE
RBC, UA: NEGATIVE
Specific Gravity, UA: 1.006 (ref 1.005–1.030)
Urobilinogen, Ur: 0.2 mg/dL (ref 0.2–1.0)
pH, UA: 7 (ref 5.0–7.5)

## 2020-02-27 LAB — MICROSCOPIC EXAMINATION
Bacteria, UA: NONE SEEN
Casts: NONE SEEN /lpf
Epithelial Cells (non renal): NONE SEEN /hpf (ref 0–10)
WBC, UA: NONE SEEN /hpf (ref 0–5)

## 2020-02-27 LAB — POCT I-STAT CREATININE: Creatinine, Ser: 0.6 mg/dL (ref 0.44–1.00)

## 2020-02-27 MED ORDER — IOHEXOL 300 MG/ML  SOLN
100.0000 mL | Freq: Once | INTRAMUSCULAR | Status: AC | PRN
  Administered 2020-02-27: 100 mL via INTRAVENOUS

## 2020-03-04 ENCOUNTER — Encounter: Payer: Self-pay | Admitting: Nurse Practitioner

## 2020-03-04 NOTE — Telephone Encounter (Signed)
Can you call radiology to find out more information about this> idk

## 2020-03-14 ENCOUNTER — Other Ambulatory Visit: Payer: Self-pay

## 2020-03-14 ENCOUNTER — Ambulatory Visit (INDEPENDENT_AMBULATORY_CARE_PROVIDER_SITE_OTHER): Payer: 59 | Admitting: Nurse Practitioner

## 2020-03-14 ENCOUNTER — Encounter: Payer: Self-pay | Admitting: Nurse Practitioner

## 2020-03-14 VITALS — BP 107/54 | HR 80 | Temp 97.5°F | Resp 16 | Ht 60.0 in | Wt 166.0 lb

## 2020-03-14 DIAGNOSIS — J4520 Mild intermittent asthma, uncomplicated: Secondary | ICD-10-CM

## 2020-03-14 DIAGNOSIS — M064 Inflammatory polyarthropathy: Secondary | ICD-10-CM

## 2020-03-14 DIAGNOSIS — K582 Mixed irritable bowel syndrome: Secondary | ICD-10-CM

## 2020-03-14 DIAGNOSIS — R1084 Generalized abdominal pain: Secondary | ICD-10-CM | POA: Diagnosis not present

## 2020-03-14 DIAGNOSIS — N200 Calculus of kidney: Secondary | ICD-10-CM

## 2020-03-14 DIAGNOSIS — G43909 Migraine, unspecified, not intractable, without status migrainosus: Secondary | ICD-10-CM

## 2020-03-14 MED ORDER — TRAMADOL HCL 50 MG PO TABS: 50.0000 mg | ORAL_TABLET | Freq: Three times a day (TID) | ORAL | 2 refills | Status: DC | PRN
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
## 2020-03-15 ENCOUNTER — Encounter: Payer: Self-pay | Admitting: Nurse Practitioner

## 2020-03-17 NOTE — Progress Notes (Signed)
Discussed with patient during visit. She declines further imaging at this time .

## 2020-03-21 ENCOUNTER — Ambulatory Visit: Payer: 59 | Admitting: Hospice and Palliative Medicine

## 2020-03-31 DIAGNOSIS — N200 Calculus of kidney: Secondary | ICD-10-CM

## 2020-03-31 DIAGNOSIS — R1084 Generalized abdominal pain: Secondary | ICD-10-CM | POA: Insufficient documentation

## 2020-03-31 HISTORY — DX: Calculus of kidney: N20.0

## 2020-04-04 ENCOUNTER — Ambulatory Visit (INDEPENDENT_AMBULATORY_CARE_PROVIDER_SITE_OTHER): Payer: 59 | Admitting: Family Medicine

## 2020-04-04 ENCOUNTER — Other Ambulatory Visit: Payer: Self-pay

## 2020-04-04 ENCOUNTER — Encounter (INDEPENDENT_AMBULATORY_CARE_PROVIDER_SITE_OTHER): Payer: Self-pay | Admitting: Family Medicine

## 2020-04-04 VITALS — BP 128/72 | HR 65 | Temp 97.9°F | Ht 60.0 in | Wt 158.0 lb

## 2020-04-04 DIAGNOSIS — J4520 Mild intermittent asthma, uncomplicated: Secondary | ICD-10-CM

## 2020-04-04 DIAGNOSIS — K760 Fatty (change of) liver, not elsewhere classified: Secondary | ICD-10-CM | POA: Insufficient documentation

## 2020-04-04 DIAGNOSIS — Z903 Acquired absence of stomach [part of]: Secondary | ICD-10-CM | POA: Insufficient documentation

## 2020-04-04 DIAGNOSIS — Z0289 Encounter for other administrative examinations: Secondary | ICD-10-CM

## 2020-04-04 DIAGNOSIS — I10 Essential (primary) hypertension: Secondary | ICD-10-CM | POA: Diagnosis not present

## 2020-04-04 DIAGNOSIS — F39 Unspecified mood [affective] disorder: Secondary | ICD-10-CM

## 2020-04-04 DIAGNOSIS — Z9189 Other specified personal risk factors, not elsewhere classified: Secondary | ICD-10-CM | POA: Diagnosis not present

## 2020-04-04 DIAGNOSIS — F3289 Other specified depressive episodes: Secondary | ICD-10-CM

## 2020-04-04 DIAGNOSIS — R5383 Other fatigue: Secondary | ICD-10-CM | POA: Insufficient documentation

## 2020-04-04 DIAGNOSIS — Z6830 Body mass index (BMI) 30.0-30.9, adult: Secondary | ICD-10-CM

## 2020-04-04 DIAGNOSIS — E669 Obesity, unspecified: Secondary | ICD-10-CM

## 2020-04-04 DIAGNOSIS — R0602 Shortness of breath: Secondary | ICD-10-CM | POA: Insufficient documentation

## 2020-04-05 ENCOUNTER — Other Ambulatory Visit: Payer: Self-pay

## 2020-04-05 DIAGNOSIS — I10 Essential (primary) hypertension: Secondary | ICD-10-CM

## 2020-04-05 DIAGNOSIS — G43909 Migraine, unspecified, not intractable, without status migrainosus: Secondary | ICD-10-CM

## 2020-04-05 LAB — T4, FREE: Free T4: 0.92 ng/dL (ref 0.82–1.77)

## 2020-04-05 LAB — INSULIN, RANDOM: INSULIN: 8.2 u[IU]/mL (ref 2.6–24.9)

## 2020-04-05 LAB — HEMOGLOBIN A1C
Est. average glucose Bld gHb Est-mCnc: 128 mg/dL
Hgb A1c MFr Bld: 6.1 % — ABNORMAL HIGH (ref 4.8–5.6)

## 2020-04-05 LAB — LIPID PANEL
Chol/HDL Ratio: 3.4 ratio (ref 0.0–4.4)
Cholesterol, Total: 232 mg/dL — ABNORMAL HIGH (ref 100–199)
HDL: 68 mg/dL (ref >39)
LDL Chol Calc (NIH): 130 mg/dL — ABNORMAL HIGH (ref 0–99)
Triglycerides: 194 mg/dL — ABNORMAL HIGH (ref 0–149)
VLDL Cholesterol Cal: 34 mg/dL (ref 5–40)

## 2020-04-05 LAB — FOLATE: Folate: >20 ng/mL (ref >3.0)

## 2020-04-05 LAB — VITAMIN D 25 HYDROXY (VIT D DEFICIENCY, FRACTURES): Vit D, 25-Hydroxy: 55.2 ng/mL (ref 30.0–100.0)

## 2020-04-05 LAB — T3: T3, Total: 89 ng/dL (ref 71–180)

## 2020-04-05 LAB — TSH: TSH: 1.74 u[IU]/mL (ref 0.450–4.500)

## 2020-04-05 MED ORDER — PROPRANOLOL HCL ER 120 MG PO CP24: 120.0000 mg | ORAL_CAPSULE | Freq: Every day | ORAL | 1 refills | Status: DC

## 2020-04-05 MED ORDER — RIZATRIPTAN BENZOATE 10 MG PO TABS: 10.0000 mg | ORAL_TABLET | ORAL | 5 refills | Status: DC | PRN

## 2020-04-08 NOTE — Progress Notes (Signed)
Chief Complaint:   OBESITY [name] [name] (MR# [id number]) is a 59 y.o. female who presents for evaluation and treatment of obesity and related comorbidities. Current BMI is Body mass index is 30.86 kg/m. [name] [name] has been struggling with her weight for many years and has been unsuccessful in either losing weight, maintaining weight loss, or reaching her healthy weight goal.  [name] [name] is currently in the action stage of change and ready to dedicate time achieving and maintaining a healthier weight. [name] [name] is interested in becoming our patient and working on intensive lifestyle modifications including (but not limited to) diet and exercise for weight loss.  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  She is married to her husband, [name] [name].  She is status post gastric sleeve in 2015.  She craves sugary foods, especially before bed.  She says she likes to snack on candy.  She has a lot of emotional challenges with PTSD from her past, etc.  Her PCP is at [facility] in [place].  Briyonna's habits were reviewed today and are as follows: Her family eats meals together, she thinks her family will eat healthier with her, her desired weight loss is 36 pounds, she started gaining weight with depression, her heaviest weight ever was 180 pounds, she craves sugar and ice cream, she snacks frequently in the evenings, she wakes up frequently in the middle of the night to eat, she skips lunch frequently, she frequently makes poor food choices and she struggles with emotional eating.  This is the patient's first visit at [facility].  The patient's NEW PATIENT PACKET that they filled out prior to today's office visit was reviewed at length and information from that paperwork was included within the following office visit note.    Included in the packet: current and past health history, medications, allergies, ROS, gynecologic history (women only), surgical history,  family history, social history, weight history, weight loss surgery history (for those that have had weight loss surgery), nutritional evaluation, mood and food questionnaire along with a depression screening (PHQ9) on all patients, an Epworth questionnaire, sleep habits questionnaire, patient life and health improvement goals questionnaire. These will all be scanned into the patient's chart under media.   During the visit, I independently reviewed the patient's ECG, bioimpedance scale results, and indirect calorimeter results.  I used this information to tailor a meal plan for the patient that will help [name] [name] to lose weight and will improve her obesity-related conditions going forward.  I performed a medically necessary appropriate examination/ evaluation.  I discussed the assessment and treatment plan with the patient. The patient was provided an opportunity to ask questions and all were answered. The patient agreed with the plan and demonstrated an understanding of the instructions.  - Labs were ordered today (unless patient declined them) and will be reviewed with the patient at our next visit unless more critical results need to be addressed immediately. Clinical information was updated and documented in the EMR by my medical staff and me.  Time spent on visit including pre-visit chart review and post-visit care was estimated to be 60-74 minutes.     Depression Screen [name] [name]'s Food and Mood (modified PHQ-9) score was 20. No SI/HI  Depression screen [facility] 2/9 04/04/2020  Decreased Interest 3  Down, Depressed, Hopeless 3  PHQ - 2 Score 6  Altered sleeping 2  Tired, decreased energy 3  Change in appetite 3  Feeling bad or failure about  yourself  2  Trouble concentrating 3  Moving slowly or fidgety/restless 0  Suicidal thoughts 1  PHQ-9 Score 20  Difficult doing work/chores Somewhat difficult     Assessment/Plan:    1. Other fatigue Riva denies daytime somnolence and reports  waking up still tired. Patent has a history of symptoms of morning fatigue and snoring. Ethelyne generally gets 6 hours of sleep per night, and states that she has poor quality sleep. Snoring is present. Apneic episodes are not present. Epworth Sleepiness Score is 4.  [name] [name] does feel that her weight is causing her energy to be lower than it should be. Fatigue may be related to obesity, depression or many other causes. Labs will be ordered, and in the meanwhile, [name] [name] will focus on self care including making healthy food choices, increasing physical activity and focusing on stress reduction.  - EKG 12-Lead - Folate - Hemoglobin A1c - Insulin, random - Lipid panel - T3 - VITAMIN D 25 Hydroxy (Vit-D Deficiency, Fractures) - TSH - T4, free   2. SOB (shortness of breath) on exertion Andretta notes increasing shortness of breath with exercising and seems to be worsening over time with weight gain. She notes getting out of breath sooner with activity than she used to. This has gotten worse recently. [name] [name] denies shortness of breath at rest or orthopnea.  [name] [name] does feel that she gets out of breath more easily that she used to when she exercises. [name] [name]'s shortness of breath appears to be obesity related and exercise induced. She has agreed to work on weight loss and gradually increase exercise to treat her exercise induced shortness of breath. Will continue to monitor closely.  - Folate - Hemoglobin A1c - Insulin, random - Lipid panel - T3 - VITAMIN D 25 Hydroxy (Vit-D Deficiency, Fractures) - TSH - T4, free   3. Essential hypertension Review: taking medications as instructed, no medication side effects noted, no chest pain on exertion, no dyspnea on exertion, no swelling of ankles.  - She is taking propranolol and verapamil.  Tolerating medications well.  Plan:  Blood pressure is at goal.  Continue medications per PCP/specialists.  Will check labs today.   BP Readings from Last  3 Encounters:  04/04/20 128/72  03/14/20 (!) 107/54  02/26/20 110/80   - Lipid panel - T3 - TSH - T4, free    4. Nonalcoholic hepatosteatosis [name] [name] says she has a history of fatty liver.   - Pt tells me she has a lot of GI issues and intolerances.   She was recently seen by GI at [facility], who recommended that she follow FODMAP diet and they explained sginificant limitations to her.  Plan:   I explained to pt that I do not think it would be a good time for her to start this wt loss journey and meal plan due to recent Webb.  She tells me that despite FODMAP recommendations, she would like to proceed with our meal plan- she wants to try it. - Explained to pt if she is intolerant to foods on meal plan, she will have to postpone our wt loss program and work with her gastro docs until she is able to participate.  Told her she can return for up to 6 mo or less if she chooses.    Lab Results  Component Value Date   ALT 36 (H) 11/18/2017   AST 22 11/18/2017   ALKPHOS 101 11/18/2017   BILITOT 0.3 11/18/2017     5. Mild  intermittent asthma without complication Captola is taking Singulair, albuterol, and Advair.  She denies concerns.  Symptoms are well-controlled currently.  Plan:  Continue medications as currently prescribed by PCP/ specialists.    6. S/P gastric sleeve procedure- 2015 Harjit had gastric bypass in 2015.    Plan:  Angelicia is at risk for malnutrition due to her previous bariatric surgery.  Counseling  You may need to eat 5 small meals each day in order to reach your protein and calorie goals.    Break up the foods on plan- which we discussed in detail today!!  Allow at least 15 minutes for each meal so that you can eat mindfully. Listen to your body so that you do not overeat. For most people, your sleeve or pouch will comfortably hold 4-6 ounces.  Eat foods from all food groups. This includes fruits and vegetables, grains, dairy, and meat and other  proteins.  Include a protein-rich food at every meal and snack, and eat the protein food first.   You should be taking a Bariatric Multivitamin as well as calcium.   - Folate - Hemoglobin A1c - Insulin, random - Lipid panel - T3 - VITAMIN D 25 Hydroxy (Vit-D Deficiency, Fractures) - TSH - T4, free      7. Other depression, with emotional eating  Navada is taking Abilify, Zoloft, Seroquel, Xanax, and Buspar.  She sees Dr. Burt Ek of psychiatry in [place], [place].  She has history of bipolar disorder and PTSD.  Positive family history of psychiatric conditions.  PHQ-9 is 20.  Plan:  Treatment per Psychiatry.  Recommend counseling on a regular basis as well to explore emotions related to food/eating habits.   She will talk to her psychiatrist about referral to one he recommends.  - Folate - Hemoglobin A1c - Insulin, random - Lipid panel - T3 - VITAMIN D 25 Hydroxy (Vit-D Deficiency, Fractures) - TSH - T4, free    8. At risk for impaired metabolic function Due to Nazarene's current state of health and medical condition(s), they are at a significantly higher risk for impaired metabolic function.   This further also puts the patient at much greater risk to also subsequently develop cardiopulmonary conditions that can negatively affect patient's quality of life as well.  At least 24 minutes was spent on counseling Burgundy about these concerns today and I stressed the importance of reversing these risks factors.   Initial goal is to lose at least 5-10% of starting weight to help reduce risk factors.   Counseling: Intensive lifestyle modifications discussed with Maham as most appropriate first line treatment.  she will continue to work on diet, exercise and weight loss efforts.  We will continue to reassess these conditions on a fairly regular basis in an attempt to decrease patient's overall morbidity and mortality    9. Class 1 obesity with serious comorbidity and body mass index (BMI)  of 30.0 to 30.9 in adult, unspecified obesity type  Mariadelaluz is currently in the action stage of change and her goal is to continue with weight loss efforts. I recommend Rissie begin the structured treatment plan as follows:  She has agreed to the Category 1 Plan.  Exercise goals: As is.   Behavioral modification strategies: meal planning and cooking strategies, keeping healthy foods in the home and planning for success.  She was informed of the importance of frequent follow-up visits to maximize her success with intensive lifestyle modifications for her multiple health conditions. She was informed we would discuss her  lab results at her next visit unless there is a critical issue that needs to be addressed sooner. Zeynep agreed to keep her next visit at the agreed upon time to discuss these results.   Objective:   Blood pressure 128/72, pulse 65, temperature 97.9 F (36.6 C), height 5' (1.524 m), weight 158 lb (71.7 kg), last menstrual period 10/23/2011, SpO2 97 %. Body mass index is 30.86 kg/m.  EKG: Normal sinus rhythm, rate 62 bpm.  Indirect Calorimeter completed today shows a VO2 of 246 and a REE of 1713.  Her calculated basal metabolic rate is 4503 thus her basal metabolic rate is better than expected.  General: Cooperative, alert, well developed, in no acute distress. HEENT: Conjunctivae and lids unremarkable. Cardiovascular: Regular rhythm.  Lungs: Normal work of breathing. Neurologic: No focal deficits.   Lab Results  Component Value Date   CREATININE 0.60 02/27/2020   BUN 16 11/18/2017   NA 142 11/18/2017   K 4.3 11/18/2017   CL 104 11/18/2017   CO2 24 11/18/2017   Lab Results  Component Value Date   ALT 36 (H) 11/18/2017   AST 22 11/18/2017   ALKPHOS 101 11/18/2017   BILITOT 0.3 11/18/2017   Lab Results  Component Value Date   HGBA1C 6.1 (H) 04/04/2020   HGBA1C 5.6 05/22/2013   Lab Results  Component Value Date   INSULIN 8.2 04/04/2020   Lab Results    Component Value Date   TSH 1.740 04/04/2020   Lab Results  Component Value Date   CHOL 232 (H) 04/04/2020   HDL 68 04/04/2020   LDLCALC 130 (H) 04/04/2020   TRIG 194 (H) 04/04/2020   CHOLHDL 3.4 04/04/2020   Lab Results  Component Value Date   WBC 7.0 11/18/2017   HGB 13.3 11/18/2017   HCT 39.6 11/18/2017   MCV 90 11/18/2017   PLT 290 11/18/2017   Lab Results  Component Value Date   IRON 86 11/18/2017   TIBC 352 11/18/2017   FERRITIN 24 11/18/2017   Attestation Statements:   Reviewed by clinician on day of visit: allergies, medications, problem list, medical history, surgical history, family history, social history, and previous encounter notes.  I, Water quality scientist, CMA, am acting as Location manager for [employer], DO.  I have reviewed the above documentation for accuracy and completeness, and I agree with the above. Marjory Sneddon, D.O.  The [place] was signed into law in 2016 which includes the topic of electronic health records.  This provides immediate access to information in MyChart.  This includes consultation notes, operative notes, office notes, lab results and pathology reports.  If you have any questions about what you read please let us know at your next visit so we can discuss your concerns and take corrective action if need be.  We are right here with you.

## 2020-04-10 ENCOUNTER — Ambulatory Visit: Payer: 59 | Admitting: Nurse Practitioner

## 2020-04-10 ENCOUNTER — Other Ambulatory Visit: Payer: Self-pay

## 2020-04-10 DIAGNOSIS — Z20822 Contact with and (suspected) exposure to covid-19: Secondary | ICD-10-CM

## 2020-04-10 LAB — POC COVID19 BINAXNOW: SARS Coronavirus 2 Ag: NEGATIVE

## 2020-04-17 LAB — POC COVID19 BINAXNOW: SARS Coronavirus 2 Ag: NEGATIVE

## 2020-04-18 ENCOUNTER — Ambulatory Visit (INDEPENDENT_AMBULATORY_CARE_PROVIDER_SITE_OTHER): Payer: 59 | Admitting: Family Medicine

## 2020-04-18 ENCOUNTER — Encounter (INDEPENDENT_AMBULATORY_CARE_PROVIDER_SITE_OTHER): Payer: Self-pay

## 2020-04-18 ENCOUNTER — Other Ambulatory Visit: Payer: Self-pay

## 2020-04-18 ENCOUNTER — Ambulatory Visit: Payer: 59

## 2020-04-18 DIAGNOSIS — Z20822 Contact with and (suspected) exposure to covid-19: Secondary | ICD-10-CM

## 2020-04-18 NOTE — Progress Notes (Signed)
Pt here for her weekly Covid test; test negative

## 2020-04-22 ENCOUNTER — Other Ambulatory Visit: Payer: Self-pay

## 2020-04-22 ENCOUNTER — Ambulatory Visit: Payer: 59

## 2020-04-22 DIAGNOSIS — Z20822 Contact with and (suspected) exposure to covid-19: Secondary | ICD-10-CM

## 2020-04-22 LAB — POC COVID19 BINAXNOW: SARS Coronavirus 2 Ag: POSITIVE — AB

## 2020-04-22 NOTE — Progress Notes (Signed)
Pt tested due to cough that started today; rapid antigen positive; pt aware and Marigene Ehlers, FNP notified

## 2020-05-06 ENCOUNTER — Other Ambulatory Visit: Payer: Self-pay | Admitting: Hospice and Palliative Medicine

## 2020-05-06 ENCOUNTER — Other Ambulatory Visit: Payer: Self-pay

## 2020-05-06 ENCOUNTER — Ambulatory Visit
Admission: RE | Admit: 2020-05-06 | Discharge: 2020-05-06 | Disposition: A | Discharge status: Home / Self Care | Payer: 59 | Attending: Hospice and Palliative Medicine | Admitting: Hospice and Palliative Medicine

## 2020-05-06 ENCOUNTER — Ambulatory Visit
Admission: RE | Admit: 2020-05-06 | Discharge: 2020-05-06 | Disposition: A | Discharge status: Home / Self Care | Payer: 59 | Source: Ambulatory Visit | Attending: Hospice and Palliative Medicine | Admitting: Hospice and Palliative Medicine

## 2020-05-06 DIAGNOSIS — R059 Cough, unspecified: Secondary | ICD-10-CM

## 2020-05-06 MED ORDER — AZITHROMYCIN 250 MG PO TABS: ORAL_TABLET | ORAL | 0 refills | Status: DC

## 2020-05-06 MED ORDER — AMOXICILLIN 500 MG PO CAPS: 1000.0000 mg | ORAL_CAPSULE | Freq: Three times a day (TID) | ORAL | 0 refills | Status: DC

## 2020-05-06 NOTE — Progress Notes (Signed)
Called and spoke with her about chest xray results. Has developed PNU secondary to COVID19--will start antibiotics. Denies shortness of breath or trouble breathing. Instructed to start antibiotics right away and to purchase a pulse oximeter. Advised to contact me tomorrow with SpO2 readings--will touch base again on Friday.

## 2020-05-07 ENCOUNTER — Telehealth: Payer: Self-pay

## 2020-05-07 ENCOUNTER — Encounter: Payer: Self-pay | Admitting: Internal Medicine

## 2020-05-07 ENCOUNTER — Other Ambulatory Visit: Payer: Self-pay

## 2020-05-07 MED ORDER — BENZONATATE 100 MG PO CAPS
100.0000 mg | ORAL_CAPSULE | Freq: Three times a day (TID) | ORAL | 0 refills | Status: DC | PRN
Start: 2020-05-07 — End: 2020-06-13

## 2020-05-07 MED ORDER — AMOXICILLIN 500 MG PO CAPS
1000.0000 mg | ORAL_CAPSULE | Freq: Three times a day (TID) | ORAL | 0 refills | Status: DC
Start: 2020-05-07 — End: 2020-06-13

## 2020-05-07 MED ORDER — AZITHROMYCIN 250 MG PO TABS
ORAL_TABLET | ORAL | 0 refills | Status: DC
Start: 2020-05-07 — End: 2020-06-13

## 2020-05-07 NOTE — Telephone Encounter (Signed)
Pt called that [facility] having issue so they are closed as per taylor [facility] and also send tessalon perle for cough

## 2020-05-07 NOTE — Telephone Encounter (Signed)
Called and canceled pres [facility]

## 2020-05-08 ENCOUNTER — Ambulatory Visit: Payer: 59 | Admitting: Hospice and Palliative Medicine

## 2020-05-09 ENCOUNTER — Ambulatory Visit (INDEPENDENT_AMBULATORY_CARE_PROVIDER_SITE_OTHER): Payer: 59 | Admitting: Family Medicine

## 2020-05-09 ENCOUNTER — Ambulatory Visit: Payer: 59 | Admitting: Internal Medicine

## 2020-05-16 ENCOUNTER — Other Ambulatory Visit: Payer: Self-pay

## 2020-05-16 ENCOUNTER — Ambulatory Visit (INDEPENDENT_AMBULATORY_CARE_PROVIDER_SITE_OTHER): Payer: 59 | Admitting: Internal Medicine

## 2020-05-16 ENCOUNTER — Encounter: Payer: Self-pay | Admitting: Internal Medicine

## 2020-05-16 ENCOUNTER — Telehealth: Payer: Self-pay

## 2020-05-16 VITALS — BP 146/92 | HR 67 | Temp 97.3°F | Resp 16 | Ht 60.0 in | Wt 155.2 lb

## 2020-05-16 DIAGNOSIS — J1282 Pneumonia due to coronavirus disease 2019: Secondary | ICD-10-CM | POA: Diagnosis not present

## 2020-05-16 DIAGNOSIS — I10 Essential (primary) hypertension: Secondary | ICD-10-CM | POA: Diagnosis not present

## 2020-05-16 DIAGNOSIS — G933 Postviral fatigue syndrome: Secondary | ICD-10-CM | POA: Diagnosis not present

## 2020-05-16 DIAGNOSIS — U071 COVID-19: Secondary | ICD-10-CM | POA: Diagnosis not present

## 2020-05-16 DIAGNOSIS — G9331 Postviral fatigue syndrome: Secondary | ICD-10-CM

## 2020-05-16 NOTE — Telephone Encounter (Signed)
Lmom Absence management paperwork completed and ready to be picked up at front desk. Copy placed in scan.

## 2020-05-16 NOTE — Telephone Encounter (Signed)
FMLA paperwork placed on DFK desk to be sign and placed back in front drawer for pick up
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
## 2020-05-17 LAB — CBC WITH DIFFERENTIAL/PLATELET
Basophils Absolute: 0.1 10*3/uL (ref 0.0–0.2)
Basos: 2 %
EOS (ABSOLUTE): 0.2 10*3/uL (ref 0.0–0.4)
Eos: 3 %
Hematocrit: 35.8 % (ref 34.0–46.6)
Hemoglobin: 11.9 g/dL (ref 11.1–15.9)
Immature Grans (Abs): 0 10*3/uL (ref 0.0–0.1)
Immature Granulocytes: 0 %
Lymphocytes Absolute: 1.8 10*3/uL (ref 0.7–3.1)
Lymphs: 34 %
MCH: 28.4 pg (ref 26.6–33.0)
MCHC: 33.2 g/dL (ref 31.5–35.7)
MCV: 85 fL (ref 79–97)
Monocytes Absolute: 0.6 10*3/uL (ref 0.1–0.9)
Monocytes: 11 %
Neutrophils Absolute: 2.8 10*3/uL (ref 1.4–7.0)
Neutrophils: 50 %
Platelets: 398 10*3/uL (ref 150–450)
RBC: 4.19 x10E6/uL (ref 3.77–5.28)
RDW: 14.2 % (ref 11.7–15.4)
WBC: 5.5 10*3/uL (ref 3.4–10.8)

## 2020-05-17 LAB — D-DIMER, QUANTITATIVE: D-DIMER: 1.65 mg/L FEU — ABNORMAL HIGH (ref 0.00–0.49)

## 2020-05-21 ENCOUNTER — Other Ambulatory Visit: Payer: Self-pay

## 2020-05-21 DIAGNOSIS — J3089 Other allergic rhinitis: Secondary | ICD-10-CM

## 2020-05-21 DIAGNOSIS — G43909 Migraine, unspecified, not intractable, without status migrainosus: Secondary | ICD-10-CM

## 2020-05-21 DIAGNOSIS — J302 Other seasonal allergic rhinitis: Secondary | ICD-10-CM

## 2020-05-21 MED ORDER — RIZATRIPTAN BENZOATE 10 MG PO TABS: 10.0000 mg | ORAL_TABLET | ORAL | 1 refills | Status: DC | PRN

## 2020-05-21 MED ORDER — MONTELUKAST SODIUM 10 MG PO TABS: 10.0000 mg | ORAL_TABLET | Freq: Every day | ORAL | 3 refills | Status: DC

## 2020-05-22 ENCOUNTER — Other Ambulatory Visit: Payer: Self-pay | Admitting: Nurse Practitioner

## 2020-05-22 ENCOUNTER — Ambulatory Visit
Admission: RE | Admit: 2020-05-22 | Discharge: 2020-05-22 | Disposition: A | Discharge status: Home / Self Care | Payer: 59 | Attending: Nurse Practitioner | Admitting: Nurse Practitioner

## 2020-05-22 ENCOUNTER — Other Ambulatory Visit: Payer: Self-pay

## 2020-05-22 ENCOUNTER — Ambulatory Visit
Admission: RE | Admit: 2020-05-22 | Discharge: 2020-05-22 | Disposition: A | Discharge status: Home / Self Care | Payer: 59 | Source: Ambulatory Visit | Attending: Nurse Practitioner | Admitting: Nurse Practitioner

## 2020-05-22 DIAGNOSIS — S0992XA Unspecified injury of nose, initial encounter: Secondary | ICD-10-CM | POA: Insufficient documentation

## 2020-05-23 NOTE — Telephone Encounter (Signed)
I signed it and gave to someone

## 2020-05-24 ENCOUNTER — Telehealth: Payer: Self-pay

## 2020-05-24 NOTE — Telephone Encounter (Signed)
Attending physician statement signed by provider for FMLA and given to patient. Copy placed in scan.

## 2020-05-30 ENCOUNTER — Telehealth: Payer: Self-pay

## 2020-05-30 NOTE — Telephone Encounter (Signed)
Received Disability forms via fax from [place], upon speaking with the patient she advised me to disregard the forms that it has already been taken care of by the provider.

## 2020-06-13 ENCOUNTER — Encounter: Payer: Self-pay | Admitting: Nurse Practitioner

## 2020-06-13 ENCOUNTER — Other Ambulatory Visit: Payer: Self-pay

## 2020-06-13 ENCOUNTER — Ambulatory Visit: Payer: 59 | Admitting: Nurse Practitioner

## 2020-06-13 VITALS — BP 128/82 | HR 75 | Temp 97.5°F | Resp 16 | Ht 60.0 in | Wt 155.4 lb

## 2020-06-13 DIAGNOSIS — J4520 Mild intermittent asthma, uncomplicated: Secondary | ICD-10-CM | POA: Diagnosis not present

## 2020-06-13 DIAGNOSIS — M5440 Lumbago with sciatica, unspecified side: Secondary | ICD-10-CM

## 2020-06-13 DIAGNOSIS — I10 Essential (primary) hypertension: Secondary | ICD-10-CM

## 2020-06-13 DIAGNOSIS — K219 Gastro-esophageal reflux disease without esophagitis: Secondary | ICD-10-CM

## 2020-06-13 DIAGNOSIS — G8929 Other chronic pain: Secondary | ICD-10-CM

## 2020-06-13 DIAGNOSIS — M064 Inflammatory polyarthropathy: Secondary | ICD-10-CM

## 2020-06-13 MED ORDER — PANTOPRAZOLE SODIUM 20 MG PO TBEC: 20.0000 mg | DELAYED_RELEASE_TABLET | Freq: Every day | ORAL | 4 refills | Status: DC

## 2020-06-13 MED ORDER — TRAMADOL HCL 50 MG PO TABS: 50.0000 mg | ORAL_TABLET | Freq: Three times a day (TID) | ORAL | 2 refills | Status: DC | PRN

## 2020-06-13 MED ORDER — CYCLOBENZAPRINE HCL 10 MG PO TABS: ORAL_TABLET | ORAL | 0 refills | Status: DC
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
## 2020-06-17 ENCOUNTER — Other Ambulatory Visit: Payer: Self-pay

## 2020-06-17 DIAGNOSIS — J302 Other seasonal allergic rhinitis: Secondary | ICD-10-CM

## 2020-06-17 DIAGNOSIS — J3089 Other allergic rhinitis: Secondary | ICD-10-CM

## 2020-06-17 MED ORDER — MOMETASONE FUROATE 50 MCG/ACT NA SUSP: 2.0000 | Freq: Every day | NASAL | 3 refills | Status: DC

## 2020-06-18 ENCOUNTER — Other Ambulatory Visit: Payer: Self-pay | Admitting: Nurse Practitioner

## 2020-06-18 ENCOUNTER — Encounter: Payer: Self-pay | Admitting: Nurse Practitioner

## 2020-06-18 DIAGNOSIS — I10 Essential (primary) hypertension: Secondary | ICD-10-CM

## 2020-06-18 MED ORDER — VERAPAMIL HCL 40 MG PO TABS: ORAL_TABLET | ORAL | 3 refills | Status: DC

## 2020-06-28 DIAGNOSIS — G8929 Other chronic pain: Secondary | ICD-10-CM | POA: Insufficient documentation

## 2020-07-03 ENCOUNTER — Other Ambulatory Visit: Payer: Self-pay

## 2020-07-03 DIAGNOSIS — J4520 Mild intermittent asthma, uncomplicated: Secondary | ICD-10-CM

## 2020-07-03 MED ORDER — ALBUTEROL SULFATE HFA 108 (90 BASE) MCG/ACT IN AERS: 2.0000 | INHALATION_SPRAY | Freq: Four times a day (QID) | RESPIRATORY_TRACT | 2 refills | Status: DC | PRN

## 2020-08-15 ENCOUNTER — Other Ambulatory Visit: Payer: Self-pay

## 2020-08-15 ENCOUNTER — Ambulatory Visit (INDEPENDENT_AMBULATORY_CARE_PROVIDER_SITE_OTHER): Payer: 59 | Admitting: Nurse Practitioner

## 2020-08-15 ENCOUNTER — Encounter: Payer: Self-pay | Admitting: Nurse Practitioner

## 2020-08-15 VITALS — BP 127/80 | HR 60 | Temp 98.2°F | Ht 61.0 in | Wt 163.9 lb

## 2020-08-15 DIAGNOSIS — A0472 Enterocolitis due to Clostridium difficile, not specified as recurrent: Secondary | ICD-10-CM | POA: Diagnosis not present

## 2020-08-15 DIAGNOSIS — A0811 Acute gastroenteropathy due to Norwalk agent: Secondary | ICD-10-CM | POA: Diagnosis not present
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
## 2020-08-15 NOTE — Patient Instructions (Signed)
Clostridioides Difficile Infection Clostridioides difficile infection, or C. diff, is an infection that is caused by C. diff germs (bacteria). This infection may happen after you take antibiotics that kill other germs and let C. diff germs grow. C. diff can be spread from person to person (is contagious). What are the causes?  Taking certain antibiotics.  Coming in contact with people, food, or things that have C. diff. What increases the risk?  Taking certain antibiotics for a long time.  Staying in a hospital or long-term care facility for a long time.  Being age 57 or older.  Having had C. diff before or been exposed to C. diff.  Having a weak disease-fighting system (immune system).  Taking medicines that treat stomach acid.  Having serious health problems, including: ? Colon cancer. ? Inflammatory bowel disease (IBD).  Having had a procedure or surgery on your digestive system. What are the signs or symptoms?  Watery poop (diarrhea).  Fever.  Not feeling hungry.  Feeling like you may vomit.  Swelling, pain, cramps, or a tender belly. How is this treated? Treatment may include:  Stopping the antibiotics that caused the C. diff infection.  Taking antibiotics that kill C. diff.  Placing poop from a healthy person into your colon (fecal transplant).  Doing surgery to take out the infected part of the colon. Follow these instructions at home: Medicines  Take over-the-counter and prescription medicines only as told by your doctor.  Take antibiotic medicine as told by your doctor. Do not stop taking it even if you start to feel better.  Do not take medicines to treat watery poop unless your doctor tells you to. Eating and drinking  Follow instructions from your doctor about what to eat and drink. This may include eating bland foods in small amounts, such as: ? Bananas. ? Applesauce. ? Rice. ? Lean meats. ? Toast. ? Crackers.  To prevent loss of fluid in  your body (dehydration): ? Take in enough fluids to keep your pee pale yellow. This includes water, ice chips, clear fruit juice with water added to it, or low-calorie sports drinks. ? Take an ORS (oral rehydration solution). This drink is sold in pharmacies and retail stores.  Avoid milk, caffeine, and alcohol.   General instructions  Wash your hands often with soap and water. Do this for at least 20 seconds.  Take a bath or shower every day.  Return to your normal activities when your doctor says that it is safe.  Keep all follow-up visits. How is this prevented? Personal hygiene  Wash your hands often with soap and water. Do this for at least 20 seconds.  Wash your hands before you cook and after you use the bathroom.  Other people should wash their hands too, especially: ? People who live with you. ? People who visit you in a hospital or clinic.   Contact precautions  If you get watery poop while you are in the hospital or a long-term care facility, tell your doctor right away.  When you visit someone in the hospital or a long-term care facility, wear a gown, gloves, or other protection.  If possible: ? Stay away from people who have diarrhea. ? Use a separate bathroom if you are sick and live with other people. Clean environment  Keep your home clean. ? Clean your home every day for at least a week after you leave the hospital.  Clean surfaces that you touch every day. Use a product that has a  10% chlorine bleach solution. Be sure to: ? Read the label on your product to make sure that the product will kill the germs on your surfaces. ? Clean toilets and flush handles, bathtubs, sinks, doorknobs and handles, countertops, and work surfaces.  If you are in the hospital, make sure the surfaces in your room are cleaned each day. Tell someone right away if body fluids have splashed or spilled. Clothes and linens  Wash clothes and linens using laundry soap that has chlorine  bleach. Be sure to: ? Use powder soap instead of liquid. ? Clean your washing machine once a month. To do this, turn on the hot setting with only soap in it. Contact a doctor if:  Your symptoms do not get better or they get worse.  Your symptoms go away and then come back.  You have a fever.  You have new symptoms. Get help right away if:  Your belly is more tender or you have more pain.  Your poop is mostly bloody.  Your poop looks black.  You vomit after you eat or drink.  You have signs of not having enough fluids in your body. These include: ? Dark yellow pee, very little pee, or no pee. ? Cracked lips or dry mouth. ? No tears when you cry. ? Sunken eyes. ? Feeling sleepy. ? Feeling weak or dizzy. Summary  C. diff infection is an infection that may happen after you take antibiotic medicines.  Symptoms include watery poop, fever, not feeling hungry, or feeling like you may vomit.  Treatment includes stopping the antibiotics that made you sick and taking antibiotics that kill the C. diff germs. Poop from a healthy person may also be placed into your colon.  To prevent C. diff infectionfrom spreading, wash hands often with soap and water. Do this for at least 20 seconds. Keep your home clean. This information is not intended to replace advice given to you by your health care provider. Make sure you discuss any questions you have with your health care provider. Document Revised: 09/14/2019 Document Reviewed: 09/14/2019 Elsevier Patient Education  [place].

## 2020-08-21 ENCOUNTER — Other Ambulatory Visit: Payer: Self-pay | Admitting: Nurse Practitioner

## 2020-08-21 DIAGNOSIS — I10 Essential (primary) hypertension: Secondary | ICD-10-CM

## 2020-08-21 DIAGNOSIS — G43909 Migraine, unspecified, not intractable, without status migrainosus: Secondary | ICD-10-CM

## 2020-09-08 ENCOUNTER — Other Ambulatory Visit: Payer: Self-pay | Admitting: Nurse Practitioner

## 2020-09-08 DIAGNOSIS — M5440 Lumbago with sciatica, unspecified side: Secondary | ICD-10-CM

## 2020-09-08 DIAGNOSIS — G8929 Other chronic pain: Secondary | ICD-10-CM

## 2020-09-12 ENCOUNTER — Other Ambulatory Visit (HOSPITAL_COMMUNITY): Payer: Self-pay | Admitting: Emergency Medicine

## 2020-09-12 ENCOUNTER — Ambulatory Visit: Payer: Self-pay | Admitting: Physician Assistant

## 2020-10-06 ENCOUNTER — Other Ambulatory Visit: Payer: Self-pay | Admitting: Nurse Practitioner

## 2020-10-06 DIAGNOSIS — M064 Inflammatory polyarthropathy: Secondary | ICD-10-CM

## 2020-10-07 ENCOUNTER — Encounter: Payer: Self-pay | Admitting: Nurse Practitioner

## 2020-10-07 NOTE — Telephone Encounter (Signed)
Patient last seen 08/15/20 for acute visit. Advised to follow up in April-pt was non-compliant.   Last refill given 06/13/20 #90 with 2 refills.    Refill being denied due to patient being non-compliant with follow up and she is requesting a controlled substance. AS, CMA

## 2020-10-08 ENCOUNTER — Other Ambulatory Visit: Payer: Self-pay | Admitting: Internal Medicine

## 2020-10-08 DIAGNOSIS — J4520 Mild intermittent asthma, uncomplicated: Secondary | ICD-10-CM

## 2020-10-10 ENCOUNTER — Ambulatory Visit: Payer: 59 | Admitting: Nurse Practitioner

## 2020-10-10 ENCOUNTER — Other Ambulatory Visit: Payer: Self-pay

## 2020-10-10 ENCOUNTER — Ambulatory Visit (INDEPENDENT_AMBULATORY_CARE_PROVIDER_SITE_OTHER): Payer: 59 | Admitting: Nurse Practitioner

## 2020-10-10 ENCOUNTER — Encounter: Payer: Self-pay | Admitting: Nurse Practitioner

## 2020-10-10 VITALS — BP 120/79 | HR 55 | Temp 97.1°F | Ht 60.0 in | Wt 160.2 lb

## 2020-10-10 DIAGNOSIS — M064 Inflammatory polyarthropathy: Secondary | ICD-10-CM | POA: Diagnosis not present

## 2020-10-10 DIAGNOSIS — J4520 Mild intermittent asthma, uncomplicated: Secondary | ICD-10-CM | POA: Diagnosis not present

## 2020-10-10 DIAGNOSIS — I10 Essential (primary) hypertension: Secondary | ICD-10-CM

## 2020-10-10 DIAGNOSIS — Z23 Encounter for immunization: Secondary | ICD-10-CM | POA: Diagnosis not present

## 2020-10-10 DIAGNOSIS — G43909 Migraine, unspecified, not intractable, without status migrainosus: Secondary | ICD-10-CM

## 2020-10-10 MED ORDER — PROPRANOLOL HCL ER 120 MG PO CP24: 120.0000 mg | ORAL_CAPSULE | Freq: Every day | ORAL | 1 refills | Status: DC

## 2020-10-10 MED ORDER — RIZATRIPTAN BENZOATE 10 MG PO TABS: 10.0000 mg | ORAL_TABLET | ORAL | 1 refills | Status: DC | PRN

## 2020-10-10 MED ORDER — ALBUTEROL SULFATE HFA 108 (90 BASE) MCG/ACT IN AERS: 2.0000 | INHALATION_SPRAY | Freq: Four times a day (QID) | RESPIRATORY_TRACT | 2 refills | Status: DC | PRN

## 2020-10-10 MED ORDER — TRAMADOL HCL 50 MG PO TABS: 50.0000 mg | ORAL_TABLET | Freq: Three times a day (TID) | ORAL | 2 refills | Status: DC | PRN

## 2020-10-10 NOTE — Patient Instructions (Signed)
Chronic Back Pain When back pain lasts longer than 3 months, it is called chronic back pain. Pain may get worse at certain times (flare-ups). There are things you can do at home to manage your pain. Follow these instructions at home: Pay attention to any changes in your symptoms. Take these actions to help with your pain: Managing pain and stiffness  If told, put ice on the painful area. Your doctor may tell you to use ice for 24-48 hours after the flare-up starts. To do this: ? Put ice in a plastic bag. ? Place a towel between your skin and the bag. ? Leave the ice on for 20 minutes, 2-3 times a day.  If told, put heat on the painful area. Do this as often as told by your doctor. Use the heat source that your doctor recommends, such as a moist heat pack or a heating pad. ? Place a towel between your skin and the heat source. ? Leave the heat on for 20-30 minutes. ? Take off the heat if your skin turns bright red. This is especially important if you are unable to feel pain, heat, or cold. You may have a greater risk of getting burned.  Soak in a warm bath. This can help relieve pain.      Activity  Avoid bending and other activities that make pain worse.  When standing: ? Keep your upper back and neck straight. ? Keep your shoulders pulled back. ? Avoid slouching.  When sitting: ? Keep your back straight. ? Relax your shoulders. Do not round your shoulders or pull them backward.  Do not sit or stand in one place for long periods of time.  Take short rest breaks during the day. Lying down or standing is usually better than sitting. Resting can help relieve pain.  When sitting or lying down for a long time, do some mild activity or stretching. This will help to prevent stiffness and pain.  Get regular exercise. Ask your doctor what activities are safe for you.  Do not lift anything that is heavier than 10 lb (4.5 kg) or the limit that you are told, until your doctor says that it  is safe.  To prevent injury when you lift things: ? Bend your knees. ? Keep the weight close to your body. ? Avoid twisting.  Sleep on a firm mattress. Try lying on your side with your knees slightly bent. If you lie on your back, put a pillow under your knees.   Medicines  Treatment may include medicines for pain and swelling taken by mouth or put on the skin, prescription pain medicine, or muscle relaxants.  Take over-the-counter and prescription medicines only as told by your doctor.  Ask your doctor if the medicine prescribed to you: ? Requires you to avoid driving or using machinery. ? Can cause trouble pooping (constipation). You may need to take these actions to prevent or treat trouble pooping:  Drink enough fluid to keep your pee (urine) pale yellow.  Take over-the-counter or prescription medicines.  Eat foods that are high in fiber. These include beans, whole grains, and fresh fruits and vegetables.  Limit foods that are high in fat and sugars. These include fried or sweet foods. General instructions  Do not use any products that contain nicotine or tobacco, such as cigarettes, e-cigarettes, and chewing tobacco. If you need help quitting, ask your doctor.  Keep all follow-up visits as told by your doctor. This is important. Contact a doctor if:    Your pain does not get better with rest or medicine.  Your pain gets worse, or you have new pain.  You have a high fever.  You lose weight very quickly.  You have trouble doing your normal activities. Get help right away if:  One or both of your legs or feet feel weak.  One or both of your legs or feet lose feeling (have numbness).  You have trouble controlling when you poop (have a bowel movement) or pee (urinate).  You have bad back pain and: ? You feel like you may vomit (nauseous), or you vomit. ? You have pain in your belly (abdomen). ? You have shortness of breath. ? You faint. Summary  When back pain  lasts longer than 3 months, it is called chronic back pain.  Pain may get worse at certain times (flare-ups).  Use ice and heat as told by your doctor. Your doctor may tell you to use ice after flare-ups. This information is not intended to replace advice given to you by your health care provider. Make sure you discuss any questions you have with your health care provider. Document Revised: 07/05/2019 Document Reviewed: 07/05/2019 Elsevier Patient Education  2021 Elsevier Inc.  
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
## 2020-10-18 ENCOUNTER — Ambulatory Visit
Admission: RE | Admit: 2020-10-18 | Discharge: 2020-10-18 | Disposition: A | Discharge status: Home / Self Care | Payer: 59 | Source: Ambulatory Visit | Attending: Cardiology | Admitting: Cardiology

## 2020-10-18 ENCOUNTER — Other Ambulatory Visit: Payer: Self-pay

## 2020-10-26 DIAGNOSIS — Z23 Encounter for immunization: Secondary | ICD-10-CM | POA: Insufficient documentation

## 2020-10-26 DIAGNOSIS — I10 Essential (primary) hypertension: Secondary | ICD-10-CM | POA: Insufficient documentation

## 2020-10-29 ENCOUNTER — Encounter: Payer: Self-pay | Admitting: Nurse Practitioner

## 2020-10-30 ENCOUNTER — Other Ambulatory Visit (HOSPITAL_COMMUNITY)
Admission: RE | Admit: 2020-10-30 | Discharge: 2020-10-30 | Disposition: A | Discharge status: Home / Self Care | Payer: 59 | Source: Ambulatory Visit | Attending: Obstetrics and Gynecology | Admitting: Obstetrics and Gynecology

## 2020-10-30 ENCOUNTER — Encounter: Payer: Self-pay | Admitting: Obstetrics and Gynecology

## 2020-10-30 ENCOUNTER — Ambulatory Visit (INDEPENDENT_AMBULATORY_CARE_PROVIDER_SITE_OTHER): Payer: 59 | Admitting: Obstetrics and Gynecology

## 2020-10-30 ENCOUNTER — Other Ambulatory Visit: Payer: Self-pay

## 2020-10-30 VITALS — BP 98/60 | Ht 60.0 in | Wt 167.0 lb

## 2020-10-30 DIAGNOSIS — N941 Unspecified dyspareunia: Secondary | ICD-10-CM | POA: Diagnosis not present

## 2020-10-30 DIAGNOSIS — N952 Postmenopausal atrophic vaginitis: Secondary | ICD-10-CM | POA: Diagnosis not present

## 2020-10-30 DIAGNOSIS — Z1151 Encounter for screening for human papillomavirus (HPV): Secondary | ICD-10-CM | POA: Diagnosis not present

## 2020-10-30 DIAGNOSIS — Z124 Encounter for screening for malignant neoplasm of cervix: Secondary | ICD-10-CM | POA: Diagnosis not present

## 2020-10-30 MED ORDER — ESTRADIOL 10 MCG VA TABS: ORAL_TABLET | VAGINAL | 4 refills | Status: DC

## 2020-10-30 NOTE — Patient Instructions (Signed)
I value your feedback and you entrusting us with your care. If you get a [facility] patient survey, I would appreciate you taking the time to let us know about your experience today. Thank you! ? ? ?
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
## 2020-11-01 LAB — CYTOLOGY - PAP
Comment: NEGATIVE
Diagnosis: NEGATIVE
High risk HPV: NEGATIVE

## 2020-11-03 ENCOUNTER — Other Ambulatory Visit: Payer: Self-pay | Admitting: Nurse Practitioner

## 2020-11-03 DIAGNOSIS — I10 Essential (primary) hypertension: Secondary | ICD-10-CM

## 2020-11-03 DIAGNOSIS — G43909 Migraine, unspecified, not intractable, without status migrainosus: Secondary | ICD-10-CM

## 2020-11-05 ENCOUNTER — Encounter: Payer: Self-pay | Admitting: Nurse Practitioner

## 2020-11-06 ENCOUNTER — Ambulatory Visit: Payer: 59 | Admitting: Nurse Practitioner

## 2020-11-07 ENCOUNTER — Telehealth: Payer: Self-pay

## 2020-11-07 NOTE — Telephone Encounter (Signed)
Pt states that the pharmacy only has a 4 day rx for the Vagifem and she needs a 7 day.

## 2020-11-07 NOTE — Telephone Encounter (Signed)
#  12 tabs sent in with 4RF. Pls call pharm to clarify.

## 2020-11-07 NOTE — Telephone Encounter (Signed)
Pt aware.

## 2020-11-07 NOTE — Telephone Encounter (Signed)
Pt called reporting she had surgery yesterday with AMS, reports a tender spot over her bellybutton. I told her its probably sore because they go in through the belly button, she thinks feels glue. she's aware this is normal, call back if any problems or concerns.

## 2020-11-07 NOTE — Telephone Encounter (Signed)
Yes pt should request refills. Want me to call pt?

## 2020-11-07 NOTE — Telephone Encounter (Signed)
Pharmacy says insurance only covers 4 tablets at a time.

## 2020-11-07 NOTE — Telephone Encounter (Signed)
Yes, please.

## 2020-11-07 NOTE — Telephone Encounter (Signed)
Can they use the refills I sent in?

## 2020-11-22 ENCOUNTER — Other Ambulatory Visit: Payer: Self-pay | Admitting: Adult Health

## 2020-12-07 ENCOUNTER — Other Ambulatory Visit: Payer: Self-pay | Admitting: Nurse Practitioner

## 2020-12-07 DIAGNOSIS — G8929 Other chronic pain: Secondary | ICD-10-CM

## 2020-12-14 ENCOUNTER — Encounter: Payer: Self-pay | Admitting: Nurse Practitioner

## 2020-12-16 ENCOUNTER — Telehealth: Payer: Self-pay | Admitting: Nurse Practitioner

## 2020-12-16 NOTE — Telephone Encounter (Signed)
Patient advised 30 day supply with 2 refills sent to pharamcy 10/10/20 and that she should have enough medication to last until follow up apt. Patient advised to contact pharmacy. AS, CMA  traMADol (ULTRAM) 50 MG tablet [[id number]]    Order Details Dose: 50 mg Route: Oral Frequency: 3 times daily PRN  Dispense Quantity: 90 tablet Refills: 2   Note to Pharmacy: This is not acute prescription. This is for chronic pain control. Written prescription provided today  Indications of Use: Pain       Sig: Take 1 tablet (50 mg total) by mouth 3 (three) times daily as needed.       Start Date: 10/10/20 End Date: --  Written Date: 10/10/20 Expiration Date: 04/08/21     Diagnosis Association: Inflammatory polyarthritis ([place]) (M06.4)  Original Order:  traMADol (ULTRAM) 50 MG tablet [[id number]]      Order Questions  Question Answer Comment  Supervising Provider Beatrice Lecher D        Providers  Authorizing Provider:   Ronnell Freshwater, NP  [address] [place], [place] [address]  Phone:  [phone]   Fax:  [phone]  DEA #:  [id number]   NPI:  [id number] Supervising Provider:   Hali Marry, [address] [place], [place] [address]  Phone:  [phone]   Fax:  [phone]  DEA #:  [id number]   NPI:  [id number]     Ordering User:  Ronnell Freshwater, NP         Pharmacy  [facility] [phone] [address], [place] - [place] [address] [place]  [address] [place], [place] [address]  Phone:  [phone]  Fax:  [phone]  DEA #:  [id number]  [place] Reason: --

## 2020-12-16 NOTE — Telephone Encounter (Signed)
Patient would like a refill on Tramadol and uses [facility] on [address] in [place], thanks.

## 2020-12-16 NOTE — Telephone Encounter (Signed)
Patient advised 30 day supply with 2 refills sent to pharamcy 10/10/20 and that she should have enough medication to last until follow up apt. Patient advised to contact pharmacy. AS, CMA  traMADol (ULTRAM) 50 MG tablet [[id number]]    Order Details Dose: 50 mg Route: Oral Frequency: 3 times daily PRN  Dispense Quantity: 90 tablet Refills: 2   Note to Pharmacy: This is not acute prescription. This is for chronic pain control. Written prescription provided today  Indications of Use: Pain       Sig: Take 1 tablet (50 mg total) by mouth 3 (three) times daily as needed.       Start Date: 10/10/20 End Date: --  Written Date: 10/10/20 Expiration Date: 04/08/21     Diagnosis Association: Inflammatory polyarthritis ([place]) (M06.4)  Original Order:  traMADol (ULTRAM) 50 MG tablet [[id number]]      Order Questions  Question Answer Comment  Supervising Provider Beatrice Lecher D        Providers  Authorizing Provider:   Ronnell Freshwater, NP  [address] [place], [place] [address]  Phone:  [phone]   Fax:  [phone]  DEA #:  [id number]   NPI:  [id number] Supervising Provider:   Hali Marry, [address] [place], [place] [address]  Phone:  [phone]   Fax:  [phone]  DEA #:  [id number]   NPI:  [id number]     Ordering User:  Ronnell Freshwater, NP         Pharmacy  [facility] [phone] [address], [place] - [place] AT [place]  [address] [place], [place] [address]  Phone:  [phone]  Fax:  [phone]  DEA #:  [id number]  [place] Reason: --

## 2020-12-17 ENCOUNTER — Other Ambulatory Visit: Payer: Self-pay | Admitting: Nurse Practitioner

## 2020-12-17 DIAGNOSIS — M064 Inflammatory polyarthropathy: Secondary | ICD-10-CM

## 2020-12-17 MED ORDER — TRAMADOL HCL 50 MG PO TABS: 50.0000 mg | ORAL_TABLET | Freq: Three times a day (TID) | ORAL | 1 refills | Status: DC | PRN

## 2020-12-17 NOTE — Progress Notes (Signed)
Sent 30 day prescription with one refill which will get ou to august appointment and then we will get everything back on track. Sent this to [facility]. Is that ok/?

## 2020-12-18 ENCOUNTER — Ambulatory Visit: Payer: 59 | Admitting: Medical

## 2020-12-22 ENCOUNTER — Other Ambulatory Visit: Payer: Self-pay | Admitting: Nurse Practitioner

## 2020-12-22 DIAGNOSIS — J302 Other seasonal allergic rhinitis: Secondary | ICD-10-CM

## 2020-12-22 DIAGNOSIS — J3089 Other allergic rhinitis: Secondary | ICD-10-CM

## 2020-12-24 ENCOUNTER — Other Ambulatory Visit: Payer: Self-pay | Admitting: Obstetrics and Gynecology

## 2020-12-24 ENCOUNTER — Encounter: Payer: Self-pay | Admitting: Obstetrics and Gynecology

## 2020-12-24 DIAGNOSIS — N941 Unspecified dyspareunia: Secondary | ICD-10-CM

## 2020-12-24 DIAGNOSIS — N952 Postmenopausal atrophic vaginitis: Secondary | ICD-10-CM

## 2020-12-24 NOTE — Telephone Encounter (Signed)
Pls call pharm to figure out what is going on with her Rx. #12 with 4 RF sent 5/22. Why were they cancelled?

## 2020-12-26 ENCOUNTER — Other Ambulatory Visit: Payer: Self-pay | Admitting: Nurse Practitioner

## 2020-12-26 DIAGNOSIS — I10 Essential (primary) hypertension: Secondary | ICD-10-CM

## 2021-01-16 ENCOUNTER — Other Ambulatory Visit: Payer: Self-pay

## 2021-01-16 ENCOUNTER — Ambulatory Visit (INDEPENDENT_AMBULATORY_CARE_PROVIDER_SITE_OTHER): Payer: 59 | Admitting: Nurse Practitioner

## 2021-01-16 ENCOUNTER — Other Ambulatory Visit: Payer: Self-pay | Admitting: Physician Assistant

## 2021-01-16 ENCOUNTER — Encounter: Payer: Self-pay | Admitting: Nurse Practitioner

## 2021-01-16 VITALS — BP 135/72 | HR 66 | Temp 97.6°F | Ht 60.0 in | Wt 163.8 lb

## 2021-01-16 DIAGNOSIS — J4520 Mild intermittent asthma, uncomplicated: Secondary | ICD-10-CM

## 2021-01-16 DIAGNOSIS — I10 Essential (primary) hypertension: Secondary | ICD-10-CM | POA: Diagnosis not present

## 2021-01-16 DIAGNOSIS — M064 Inflammatory polyarthropathy: Secondary | ICD-10-CM | POA: Diagnosis not present

## 2021-01-16 DIAGNOSIS — Z6831 Body mass index (BMI) 31.0-31.9, adult: Secondary | ICD-10-CM | POA: Diagnosis not present

## 2021-01-16 MED ORDER — METFORMIN HCL 500 MG PO TABS: 500.0000 mg | ORAL_TABLET | Freq: Two times a day (BID) | ORAL | 2 refills | Status: DC

## 2021-01-16 NOTE — Patient Instructions (Addendum)
Calorie Counting for Weight Loss Calories are units of energy. Your body needs a certain number of calories from food to keep going throughout the day. When you eat or drink more calories than your body needs, your body stores the extra calories mostly as fat. When you eat or drink fewer calories than your body needs, your body burns fat to getthe energy it needs. Calorie counting means keeping track of how many calories you eat and drink each day. Calorie counting can be helpful if you need to lose weight. If you eat fewer calories than your body needs, you should lose weight. Ask yourhealth care provider what a healthy weight is for you. For calorie counting to work, you will need to eat the right number of calories each day to lose a healthy amount of weight per week. A dietitian can help you figure out how many calories you need in a day and will suggest ways to reach your calorie goal. A healthy amount of weight to lose each week is usually 1-2 lb (0.5-0.9 kg). This usually means that your daily calorie intake should be reduced by 500-750 calories. Eating 1,200-1,500 calories a day can help most women lose weight. Eating 1,500-1,800 calories a day can help most men lose weight. What do I need to know about calorie counting? Work with your health care provider or dietitian to determine how many calories you should get each day. To meet your daily calorie goal, you will need to: Find out how many calories are in each food that you would like to eat. Try to do this before you eat. Decide how much of the food you plan to eat. Keep a food log. Do this by writing down what you ate and how many calories it had. To successfully lose weight, it is important to balance calorie counting with ahealthy lifestyle that includes regular activity. Where do I find calorie information?  The number of calories in a food can be found on a Nutrition Facts label. If a food does not have a Nutrition Facts label, try to  look up the calories onlineor ask your dietitian for help. Remember that calories are listed per serving. If you choose to have more than one serving of a food, you will have to multiply the calories per serving by the number of servings you plan to eat. For example, the label on a package of bread might say that a serving size is 1 slice and that there are 90 calories in a serving. If you eat 1 slice, you will have eaten 90 calories. If you eat 2slices, you will have eaten 180 calories. How do I keep a food log? After each time that you eat, record the following in your food log as soon as possible: What you ate. Be sure to include toppings, sauces, and other extras on the food. How much you ate. This can be measured in cups, ounces, or number of items. How many calories were in each food and drink. The total number of calories in the food you ate. Keep your food log near you, such as in a pocket-sized notebook or on an app or website on your mobile phone. Some programs will calculate calories for you andshow you how many calories you have left to meet your daily goal. What are some portion-control tips? Know how many calories are in a serving. This will help you know how many servings you can have of a certain food. Use a measuring cup to  measure serving sizes. You could also try weighing out portions on a kitchen scale. With time, you will be able to estimate serving sizes for some foods. Take time to put servings of different foods on your favorite plates or in your favorite bowls and cups so you know what a serving looks like. Try not to eat straight from a food's packaging, such as from a bag or box. Eating straight from the package makes it hard to see how much you are eating and can lead to overeating. Put the amount you would like to eat in a cup or on a plate to make sure you are eating the right portion. Use smaller plates, glasses, and bowls for smaller portions and to prevent  overeating. Try not to multitask. For example, avoid watching TV or using your computer while eating. If it is time to eat, sit down at a table and enjoy your food. This will help you recognize when you are full. It will also help you be more mindful of what and how much you are eating. What are tips for following this plan? Reading food labels Check the calorie count compared with the serving size. The serving size may be smaller than what you are used to eating. Check the source of the calories. Try to choose foods that are high in protein, fiber, and vitamins, and low in saturated fat, trans fat, and sodium. Shopping Read nutrition labels while you shop. This will help you make healthy decisions about which foods to buy. Pay attention to nutrition labels for low-fat or fat-free foods. These foods sometimes have the same number of calories or more calories than the full-fat versions. They also often have added sugar, starch, or salt to make up for flavor that was removed with the fat. Make a grocery list of lower-calorie foods and stick to it. Cooking Try to cook your favorite foods in a healthier way. For example, try baking instead of frying. Use low-fat dairy products. Meal planning Use more fruits and vegetables. One-half of your plate should be fruits and vegetables. Include lean proteins, such as chicken, [place], and fish. Lifestyle Each week, aim to do one of the following: 150 minutes of moderate exercise, such as walking. 75 minutes of vigorous exercise, such as running. General information Know how many calories are in the foods you eat most often. This will help you calculate calorie counts faster. Find a way of tracking calories that works for you. Get creative. Try different apps or programs if writing down calories does not work for you. What foods should I eat?  Eat nutritious foods. It is better to have a nutritious, high-calorie food, such as an avocado, than a food with  few nutrients, such as a bag of potato chips. Use your calories on foods and drinks that will fill you up and will not leave you hungry soon after eating. Examples of foods that fill you up are nuts and nut butters, vegetables, lean proteins, and high-fiber foods such as whole grains. High-fiber foods are foods with more than 5 g of fiber per serving. Pay attention to calories in drinks. Low-calorie drinks include water and unsweetened drinks. The items listed above may not be a complete list of foods and beverages you can eat. Contact a dietitian for more information. What foods should I limit? Limit foods or drinks that are not good sources of vitamins, minerals, or protein or that are high in unhealthy fats. These include: Candy. Other sweets. Sodas, specialty  coffee drinks, alcohol, and juice. The items listed above may not be a complete list of foods and beverages you should avoid. Contact a dietitian for more information. How do I count calories when eating out? Pay attention to portions. Often, portions are much larger when eating out. Try these tips to keep portions smaller: Consider sharing a meal instead of getting your own. If you get your own meal, eat only half of it. Before you start eating, ask for a container and put half of your meal into it. When available, consider ordering smaller portions from the menu instead of full portions. Pay attention to your food and drink choices. Knowing the way food is cooked and what is included with the meal can help you eat fewer calories. If calories are listed on the menu, choose the lower-calorie options. Choose dishes that include vegetables, fruits, whole grains, low-fat dairy products, and lean proteins. Choose items that are boiled, broiled, grilled, or steamed. Avoid items that are buttered, battered, fried, or served with cream sauce. Items labeled as crispy are usually fried, unless stated otherwise. Choose water, low-fat milk,  unsweetened iced tea, or other drinks without added sugar. If you want an alcoholic beverage, choose a lower-calorie option, such as a glass of wine or light beer. Ask for dressings, sauces, and syrups on the side. These are usually high in calories, so you should limit the amount you eat. If you want a salad, choose a garden salad and ask for grilled meats. Avoid extra toppings such as bacon, cheese, or fried items. Ask for the dressing on the side, or ask for olive oil and vinegar or lemon to use as dressing. Estimate how many servings of a food you are given. Knowing serving sizes will help you be aware of how much food you are eating at restaurants. Where to find more information Centers for Disease Control and Prevention: http://www.wolf.info/ U.[place] Department of Agriculture: http://www.wilson-mendoza.org/ Summary Calorie counting means keeping track of how many calories you eat and drink each day. If you eat fewer calories than your body needs, you should lose weight. A healthy amount of weight to lose per week is usually 1-2 lb (0.5-0.9 kg). This usually means reducing your daily calorie intake by 500-750 calories. The number of calories in a food can be found on a Nutrition Facts label. If a food does not have a Nutrition Facts label, try to look up the calories online or ask your dietitian for help. Use smaller plates, glasses, and bowls for smaller portions and to prevent overeating. Use your calories on foods and drinks that will fill you up and not leave you hungry shortly after a meal. This information is not intended to replace advice given to you by your health care provider. Make sure you discuss any questions you have with your healthcare provider. Document Revised: 07/06/2019 Document Reviewed: 07/06/2019 Elsevier Patient Education  2022 [place] and Cholesterol Restricted Eating Plan Getting too much fat and cholesterol in your diet may cause health problems. Choosing the right foods helps keep  your fat and cholesterol at normal levels.This can keep you from getting certain diseases. Your doctor may recommend an eating plan that includes: Total fat: ______% or less of total calories a day. Saturated fat: ______% or less of total calories a day. Cholesterol: less than _________mg a day. Fiber: ______g a day. What are tips for following this plan? Meal planning At meals, divide your plate into four equal parts: Fill one-half of  your plate with vegetables and green salads. Fill one-fourth of your plate with whole grains. Fill one-fourth of your plate with low-fat (lean) protein foods. Eat fish that is high in omega-3 fats at least two times a week. This includes mackerel, tuna, sardines, and salmon. Eat foods that are high in fiber, such as whole grains, beans, apples, broccoli, carrots, peas, and barley. General tips  Work with your doctor to lose weight if you need to. Avoid: Foods with added sugar. Fried foods. Foods with partially hydrogenated oils. Limit alcohol intake to no more than 1 drink a day for nonpregnant women and 2 drinks a day for men. One drink equals 12 oz of beer, 5 oz of wine, or 1 oz of hard liquor.  Reading food labels Check food labels for: Trans fats. Partially hydrogenated oils. Saturated fat (g) in each serving. Cholesterol (mg) in each serving. Fiber (g) in each serving. Choose foods with healthy fats, such as: Monounsaturated fats. Polyunsaturated fats. Omega-3 fats. Choose grain products that have whole grains. Look for the word "whole" as the first word in the ingredient list. Cooking Cook foods using low-fat methods. These include baking, boiling, grilling, and broiling. Eat more home-cooked foods. Eat at restaurants and buffets less often. Avoid cooking using saturated fats, such as butter, cream, palm oil, palm kernel oil, and coconut oil. Recommended foods  Fruits All fresh, canned (in natural juice), or frozen  fruits. Vegetables Fresh or frozen vegetables (raw, steamed, roasted, or grilled). Green salads. Grains Whole grains, such as whole wheat or whole grain breads, crackers, cereals, and pasta. Unsweetened oatmeal, bulgur, barley, quinoa, or brown rice. Corn or whole wheat flour tortillas. Meats and other protein foods Ground beef (85% or leaner), grass-fed beef, or beef trimmed of fat. Skinless chicken or [place]. Ground chicken or [place]. Pork trimmed of fat. All fish and seafood. Egg whites. Dried beans, peas, or lentils. Unsalted nuts or seeds. Unsalted canned beans. Nut butters without added sugar or oil. Dairy Low-fat or nonfat dairy products, such as skim or 1% milk, 2% or reduced-fat cheeses, low-fat and fat-free ricotta or cottage cheese, or plain low-fat and nonfat yogurt. Fats and oils Tub margarine without trans fats. Light or reduced-fat mayonnaise and salad dressings. Avocado. Olive, canola, sesame, or safflower oils. The items listed above may not be a complete list of foods and beverages youcan eat. Contact a dietitian for more information. Foods to avoid Fruits Canned fruit in heavy syrup. Fruit in cream or butter sauce. Fried fruit. Vegetables Vegetables cooked in cheese, cream, or butter sauce. Fried vegetables. Grains White bread. White pasta. White rice. Cornbread. Bagels, pastries, and croissants. Crackers and snack foods that contain trans fat and hydrogenated oils. Meats and other protein foods Fatty cuts of meat. Ribs, chicken wings, bacon, sausage, bologna, salami, chitterlings, fatback, hot dogs, bratwurst, and packaged lunch meats. Liver and organ meats. Whole eggs and egg yolks. Chicken and [place] with skin. Fried meat. Dairy Whole or 2% milk, cream, half-and-half, and cream cheese. Whole milk cheeses. Whole-fat or sweetened yogurt. Full-fat cheeses. Nondairy creamers and whipped toppings. Processed cheese, cheese spreads, and cheese curds. Beverages Alcohol.  Sugar-sweetened drinks such as sodas, lemonade, and fruit drinks. Fats and oils Butter, stick margarine, lard, shortening, ghee, or bacon fat. Coconut, palm kernel, and palm oils. Sweets and desserts Corn syrup, sugars, honey, and molasses. Candy. Jam and jelly. Syrup. Sweetened cereals. Cookies, pies, cakes, donuts, muffins, and ice cream. The items listed above may not be a complete list  of foods and beverages youshould avoid. Contact a dietitian for more information. Summary Choosing the right foods helps keep your fat and cholesterol at normal levels. This can keep you from getting certain diseases. At meals, fill one-half of your plate with vegetables and green salads. Eat high-fiber foods, like whole grains, beans, apples, carrots, peas, and barley. Limit added sugar, saturated fats, alcohol, and fried foods. This information is not intended to replace advice given to you by your health care provider. Make sure you discuss any questions you have with your healthcare provider. Document Revised: 09/27/2019 Document Reviewed: 09/27/2019 Elsevier Patient Education  2022 [place] for [employer] Calories are units of energy. Your body needs a certain number of calories from food to keep going throughout the day. When you eat or drink more calories than your body needs, your body stores the extra calories mostly as fat. When you eat or drink fewer calories than your body needs, your body burns fat to getthe energy it needs. Calorie counting means keeping track of how many calories you eat and drink each day. Calorie counting can be helpful if you need to lose weight. If you eat fewer calories than your body needs, you should lose weight. Ask yourhealth care provider what a healthy weight is for you. For calorie counting to work, you will need to eat the right number of calories each day to lose a healthy amount of weight per week. A dietitian can help you figure out how many  calories you need in a day and will suggest ways to reach your calorie goal. A healthy amount of weight to lose each week is usually 1-2 lb (0.5-0.9 kg). This usually means that your daily calorie intake should be reduced by 500-750 calories. Eating 1,200-1,500 calories a day can help most women lose weight. Eating 1,500-1,800 calories a day can help most men lose weight. What do I need to know about calorie counting? Work with your health care provider or dietitian to determine how many calories you should get each day. To meet your daily calorie goal, you will need to: Find out how many calories are in each food that you would like to eat. Try to do this before you eat. Decide how much of the food you plan to eat. Keep a food log. Do this by writing down what you ate and how many calories it had. To successfully lose weight, it is important to balance calorie counting with ahealthy lifestyle that includes regular activity. Where do I find calorie information?  The number of calories in a food can be found on a Nutrition Facts label. If a food does not have a Nutrition Facts label, try to look up the calories onlineor ask your dietitian for help. Remember that calories are listed per serving. If you choose to have more than one serving of a food, you will have to multiply the calories per serving by the number of servings you plan to eat. For example, the label on a package of bread might say that a serving size is 1 slice and that there are 90 calories in a serving. If you eat 1 slice, you will have eaten 90 calories. If you eat 2slices, you will have eaten 180 calories. How do I keep a food log? After each time that you eat, record the following in your food log as soon as possible: What you ate. Be sure to include toppings, sauces, and other extras on the food.  How much you ate. This can be measured in cups, ounces, or number of items. How many calories were in each food and drink. The total  number of calories in the food you ate. Keep your food log near you, such as in a pocket-sized notebook or on an app or website on your mobile phone. Some programs will calculate calories for you andshow you how many calories you have left to meet your daily goal. What are some portion-control tips? Know how many calories are in a serving. This will help you know how many servings you can have of a certain food. Use a measuring cup to measure serving sizes. You could also try weighing out portions on a kitchen scale. With time, you will be able to estimate serving sizes for some foods. Take time to put servings of different foods on your favorite plates or in your favorite bowls and cups so you know what a serving looks like. Try not to eat straight from a food's packaging, such as from a bag or box. Eating straight from the package makes it hard to see how much you are eating and can lead to overeating. Put the amount you would like to eat in a cup or on a plate to make sure you are eating the right portion. Use smaller plates, glasses, and bowls for smaller portions and to prevent overeating. Try not to multitask. For example, avoid watching TV or using your computer while eating. If it is time to eat, sit down at a table and enjoy your food. This will help you recognize when you are full. It will also help you be more mindful of what and how much you are eating. What are tips for following this plan? Reading food labels Check the calorie count compared with the serving size. The serving size may be smaller than what you are used to eating. Check the source of the calories. Try to choose foods that are high in protein, fiber, and vitamins, and low in saturated fat, trans fat, and sodium. Shopping Read nutrition labels while you shop. This will help you make healthy decisions about which foods to buy. Pay attention to nutrition labels for low-fat or fat-free foods. These foods sometimes have the same  number of calories or more calories than the full-fat versions. They also often have added sugar, starch, or salt to make up for flavor that was removed with the fat. Make a grocery list of lower-calorie foods and stick to it. Cooking Try to cook your favorite foods in a healthier way. For example, try baking instead of frying. Use low-fat dairy products. Meal planning Use more fruits and vegetables. One-half of your plate should be fruits and vegetables. Include lean proteins, such as chicken, [place], and fish. Lifestyle Each week, aim to do one of the following: 150 minutes of moderate exercise, such as walking. 75 minutes of vigorous exercise, such as running. General information Know how many calories are in the foods you eat most often. This will help you calculate calorie counts faster. Find a way of tracking calories that works for you. Get creative. Try different apps or programs if writing down calories does not work for you. What foods should I eat?  Eat nutritious foods. It is better to have a nutritious, high-calorie food, such as an avocado, than a food with few nutrients, such as a bag of potato chips. Use your calories on foods and drinks that will fill you up and will not leave  you hungry soon after eating. Examples of foods that fill you up are nuts and nut butters, vegetables, lean proteins, and high-fiber foods such as whole grains. High-fiber foods are foods with more than 5 g of fiber per serving. Pay attention to calories in drinks. Low-calorie drinks include water and unsweetened drinks. The items listed above may not be a complete list of foods and beverages you can eat. Contact a dietitian for more information. What foods should I limit? Limit foods or drinks that are not good sources of vitamins, minerals, or protein or that are high in unhealthy fats. These include: Candy. Other sweets. Sodas, specialty coffee drinks, alcohol, and juice. The items listed above  may not be a complete list of foods and beverages you should avoid. Contact a dietitian for more information. How do I count calories when eating out? Pay attention to portions. Often, portions are much larger when eating out. Try these tips to keep portions smaller: Consider sharing a meal instead of getting your own. If you get your own meal, eat only half of it. Before you start eating, ask for a container and put half of your meal into it. When available, consider ordering smaller portions from the menu instead of full portions. Pay attention to your food and drink choices. Knowing the way food is cooked and what is included with the meal can help you eat fewer calories. If calories are listed on the menu, choose the lower-calorie options. Choose dishes that include vegetables, fruits, whole grains, low-fat dairy products, and lean proteins. Choose items that are boiled, broiled, grilled, or steamed. Avoid items that are buttered, battered, fried, or served with cream sauce. Items labeled as crispy are usually fried, unless stated otherwise. Choose water, low-fat milk, unsweetened iced tea, or other drinks without added sugar. If you want an alcoholic beverage, choose a lower-calorie option, such as a glass of wine or light beer. Ask for dressings, sauces, and syrups on the side. These are usually high in calories, so you should limit the amount you eat. If you want a salad, choose a garden salad and ask for grilled meats. Avoid extra toppings such as bacon, cheese, or fried items. Ask for the dressing on the side, or ask for olive oil and vinegar or lemon to use as dressing. Estimate how many servings of a food you are given. Knowing serving sizes will help you be aware of how much food you are eating at restaurants. Where to find more information Centers for Disease Control and Prevention: http://www.wolf.info/ U.[place]. Department of Agriculture: http://www.wilson-mendoza.org/ Summary Calorie counting means keeping track of  how many calories you eat and drink each day. If you eat fewer calories than your body needs, you should lose weight. A healthy amount of weight to lose per week is usually 1-2 lb (0.5-0.9 kg). This usually means reducing your daily calorie intake by 500-750 calories. The number of calories in a food can be found on a Nutrition Facts label. If a food does not have a Nutrition Facts label, try to look up the calories online or ask your dietitian for help. Use smaller plates, glasses, and bowls for smaller portions and to prevent overeating. Use your calories on foods and drinks that will fill you up and not leave you hungry shortly after a meal. This information is not intended to replace advice given to you by your health care provider. Make sure you discuss any questions you have with your healthcare provider. Document Revised: 07/06/2019 Document  Reviewed: 07/06/2019 Elsevier Patient Education  2022 [place] and Cholesterol Restricted Eating Plan Getting too much fat and cholesterol in your diet may cause health problems. Choosing the right foods helps keep your fat and cholesterol at normal levels.This can keep you from getting certain diseases. Your doctor may recommend an eating plan that includes: Total fat: ______% or less of total calories a day. Saturated fat: ______% or less of total calories a day. Cholesterol: less than _________mg a day. Fiber: ______g a day. What are tips for following this plan? Meal planning At meals, divide your plate into four equal parts: Fill one-half of your plate with vegetables and green salads. Fill one-fourth of your plate with whole grains. Fill one-fourth of your plate with low-fat (lean) protein foods. Eat fish that is high in omega-3 fats at least two times a week. This includes mackerel, tuna, sardines, and salmon. Eat foods that are high in fiber, such as whole grains, beans, apples, broccoli, carrots, peas, and barley. General  tips  Work with your doctor to lose weight if you need to. Avoid: Foods with added sugar. Fried foods. Foods with partially hydrogenated oils. Limit alcohol intake to no more than 1 drink a day for nonpregnant women and 2 drinks a day for men. One drink equals 12 oz of beer, 5 oz of wine, or 1 oz of hard liquor.  Reading food labels Check food labels for: Trans fats. Partially hydrogenated oils. Saturated fat (g) in each serving. Cholesterol (mg) in each serving. Fiber (g) in each serving. Choose foods with healthy fats, such as: Monounsaturated fats. Polyunsaturated fats. Omega-3 fats. Choose grain products that have whole grains. Look for the word "whole" as the first word in the ingredient list. Cooking Cook foods using low-fat methods. These include baking, boiling, grilling, and broiling. Eat more home-cooked foods. Eat at restaurants and buffets less often. Avoid cooking using saturated fats, such as butter, cream, palm oil, palm kernel oil, and coconut oil. Recommended foods  Fruits All fresh, canned (in natural juice), or frozen fruits. Vegetables Fresh or frozen vegetables (raw, steamed, roasted, or grilled). Green salads. Grains Whole grains, such as whole wheat or whole grain breads, crackers, cereals, and pasta. Unsweetened oatmeal, bulgur, barley, quinoa, or brown rice. Corn or whole wheat flour tortillas. Meats and other protein foods Ground beef (85% or leaner), grass-fed beef, or beef trimmed of fat. Skinless chicken or [place]. Ground chicken or [place]. Pork trimmed of fat. All fish and seafood. Egg whites. Dried beans, peas, or lentils. Unsalted nuts or seeds. Unsalted canned beans. Nut butters without added sugar or oil. Dairy Low-fat or nonfat dairy products, such as skim or 1% milk, 2% or reduced-fat cheeses, low-fat and fat-free ricotta or cottage cheese, or plain low-fat and nonfat yogurt. Fats and oils Tub margarine without trans fats. Light or  reduced-fat mayonnaise and salad dressings. Avocado. Olive, canola, sesame, or safflower oils. The items listed above may not be a complete list of foods and beverages youcan eat. Contact a dietitian for more information. Foods to avoid Fruits Canned fruit in heavy syrup. Fruit in cream or butter sauce. Fried fruit. Vegetables Vegetables cooked in cheese, cream, or butter sauce. Fried vegetables. Grains White bread. White pasta. White rice. Cornbread. Bagels, pastries, and croissants. Crackers and snack foods that contain trans fat and hydrogenated oils. Meats and other protein foods Fatty cuts of meat. Ribs, chicken wings, bacon, sausage, bologna, salami, chitterlings, fatback, hot dogs, bratwurst, and packaged lunch meats. Liver  and organ meats. Whole eggs and egg yolks. Chicken and [place] with skin. Fried meat. Dairy Whole or 2% milk, cream, half-and-half, and cream cheese. Whole milk cheeses. Whole-fat or sweetened yogurt. Full-fat cheeses. Nondairy creamers and whipped toppings. Processed cheese, cheese spreads, and cheese curds. Beverages Alcohol. Sugar-sweetened drinks such as sodas, lemonade, and fruit drinks. Fats and oils Butter, stick margarine, lard, shortening, ghee, or bacon fat. Coconut, palm kernel, and palm oils. Sweets and desserts Corn syrup, sugars, honey, and molasses. Candy. Jam and jelly. Syrup. Sweetened cereals. Cookies, pies, cakes, donuts, muffins, and ice cream. The items listed above may not be a complete list of foods and beverages youshould avoid. Contact a dietitian for more information. Summary Choosing the right foods helps keep your fat and cholesterol at normal levels. This can keep you from getting certain diseases. At meals, fill one-half of your plate with vegetables and green salads. Eat high-fiber foods, like whole grains, beans, apples, carrots, peas, and barley. Limit added sugar, saturated fats, alcohol, and fried foods. This information is not  intended to replace advice given to you by your health care provider. Make sure you discuss any questions you have with your healthcare provider. Document Revised: 09/27/2019 Document Reviewed: 09/27/2019 Elsevier Patient Education  2022 [employer].
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
## 2021-01-17 ENCOUNTER — Encounter: Payer: Self-pay | Admitting: Nurse Practitioner

## 2021-01-17 MED ORDER — ALBUTEROL SULFATE HFA 108 (90 BASE) MCG/ACT IN AERS: 2.0000 | INHALATION_SPRAY | Freq: Four times a day (QID) | RESPIRATORY_TRACT | 2 refills | Status: AC | PRN

## 2021-01-27 ENCOUNTER — Encounter: Payer: Self-pay | Admitting: Nurse Practitioner

## 2021-01-27 DIAGNOSIS — J302 Other seasonal allergic rhinitis: Secondary | ICD-10-CM

## 2021-01-27 DIAGNOSIS — J3089 Other allergic rhinitis: Secondary | ICD-10-CM

## 2021-01-27 DIAGNOSIS — G43909 Migraine, unspecified, not intractable, without status migrainosus: Secondary | ICD-10-CM

## 2021-01-27 DIAGNOSIS — I10 Essential (primary) hypertension: Secondary | ICD-10-CM

## 2021-01-27 MED ORDER — PROPRANOLOL HCL ER 120 MG PO CP24: 120.0000 mg | ORAL_CAPSULE | Freq: Every day | ORAL | 1 refills | Status: DC

## 2021-01-27 MED ORDER — MONTELUKAST SODIUM 10 MG PO TABS: ORAL_TABLET | ORAL | 1 refills | Status: DC

## 2021-01-28 ENCOUNTER — Encounter: Payer: Self-pay | Admitting: Nurse Practitioner

## 2021-01-29 ENCOUNTER — Other Ambulatory Visit: Payer: Self-pay

## 2021-02-03 ENCOUNTER — Encounter: Payer: Self-pay | Admitting: Obstetrics and Gynecology

## 2021-02-04 ENCOUNTER — Other Ambulatory Visit: Payer: Self-pay | Admitting: Nurse Practitioner

## 2021-02-04 DIAGNOSIS — G43909 Migraine, unspecified, not intractable, without status migrainosus: Secondary | ICD-10-CM

## 2021-02-13 DIAGNOSIS — K6389 Other specified diseases of intestine: Secondary | ICD-10-CM | POA: Insufficient documentation

## 2021-02-13 DIAGNOSIS — K638219 Small intestinal bacterial overgrowth, unspecified: Secondary | ICD-10-CM | POA: Insufficient documentation

## 2021-02-14 IMAGING — CT CT ABD-PELV W/ CM
2 of 5 series · 15 of 46 positions shown, 17 images · IV contrast (APPLIED)
Comparison: September 06, 2007.
COMPARISON: September 06, 2007.

Addendum:
CLINICAL DATA: Upper abdominal pain.

EXAM:
CT ABDOMEN AND PELVIS WITH CONTRAST
TECHNIQUE: Multidetector CT imaging of the abdomen and pelvis was performed
using the standard protocol following bolus administration of
intravenous contrast.
CONTRAST:  100mL OMNIPAQUE IOHEXOL 300 MG/ML  SOLN

[Series 2: routine abd/pel with · axial · 0.72mm/px · z∈[-520,-150]mm · 12 of 84 slices shown, 14 images]
[im 5/84  soft-tissue]
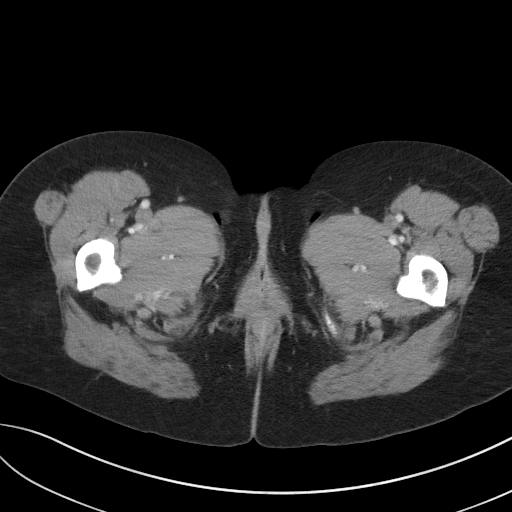
[im 5/84  bone]
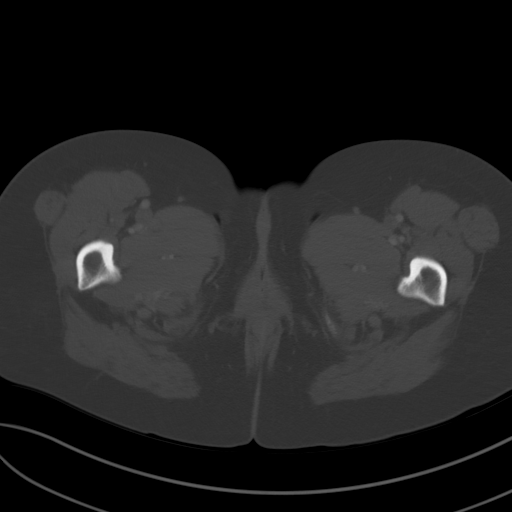
[im 14/84  soft-tissue]
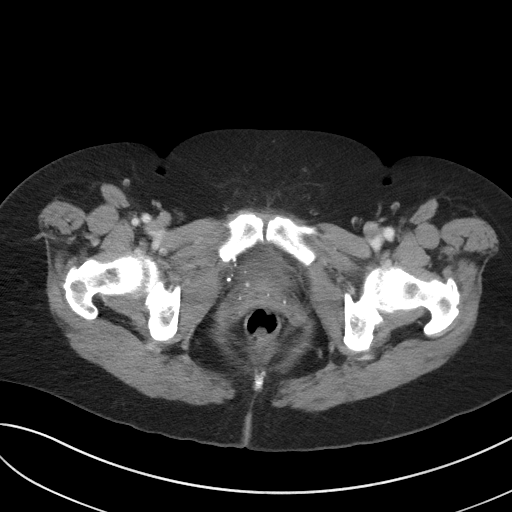
[im 18/84  soft-tissue]
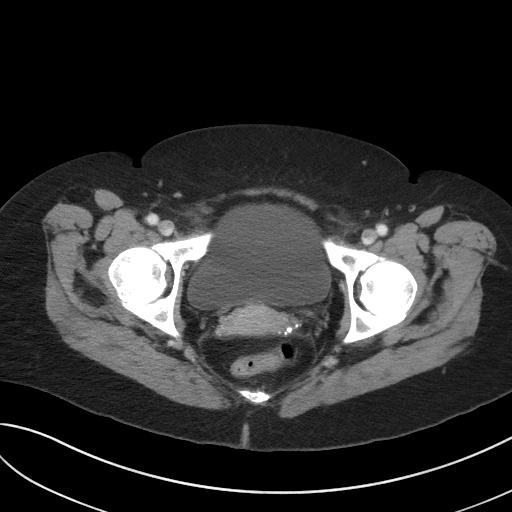
[im 27/84  soft-tissue]
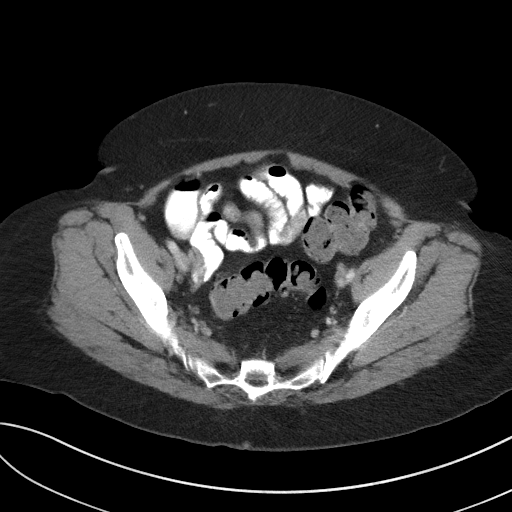
[im 31/84  soft-tissue]
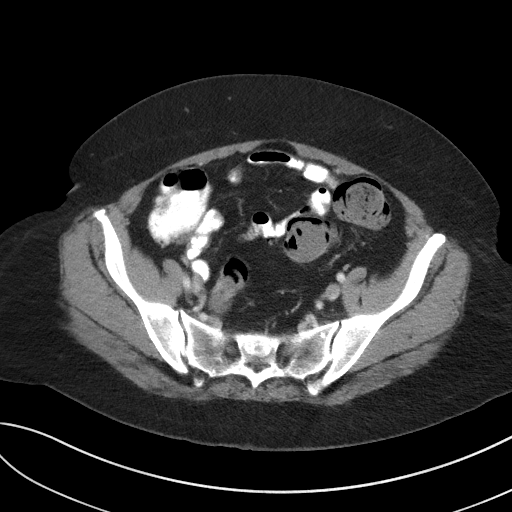
[im 40/84  soft-tissue]
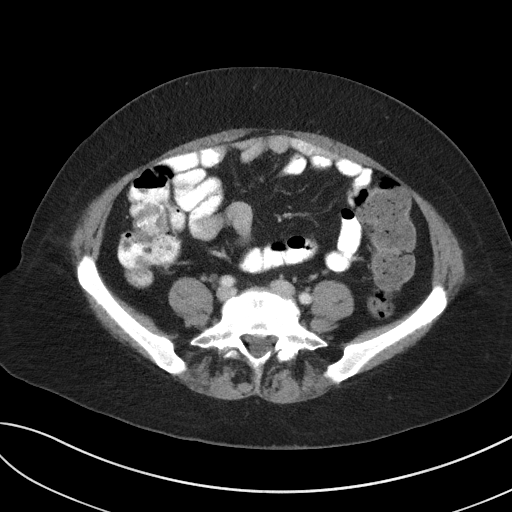
[im 44/84  soft-tissue]
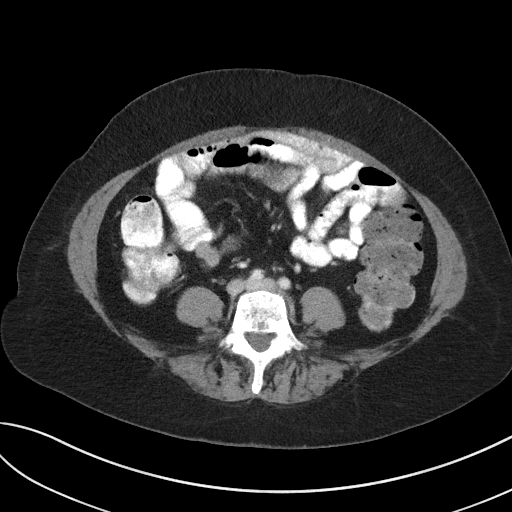
[im 53/84  soft-tissue]
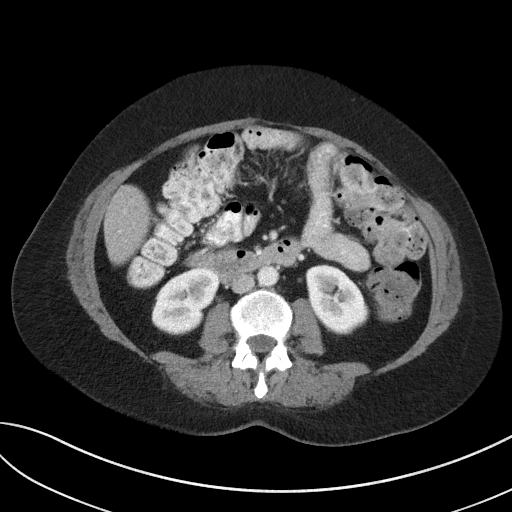
[im 57/84  soft-tissue]
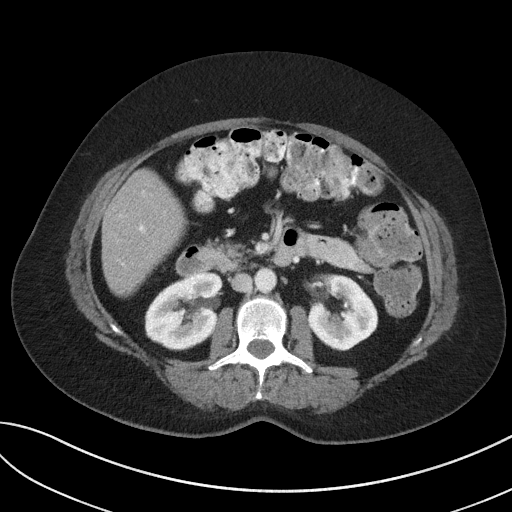
[im 57/84  bone]
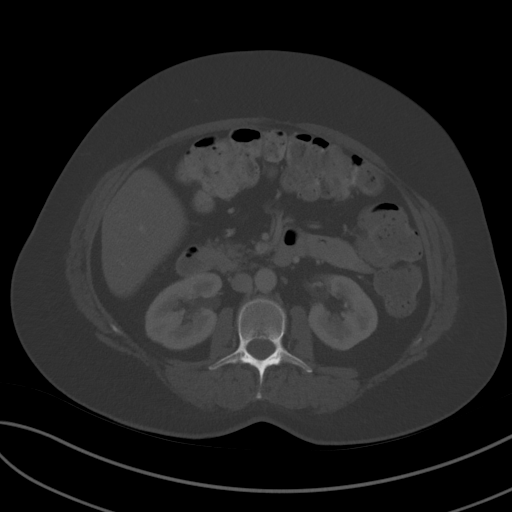
[im 66/84  soft-tissue]
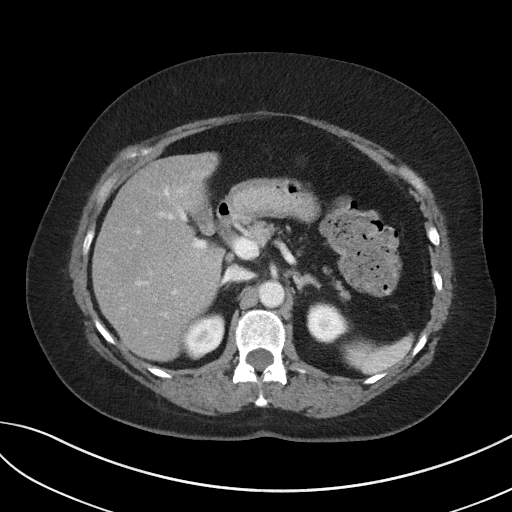
[im 70/84  soft-tissue]
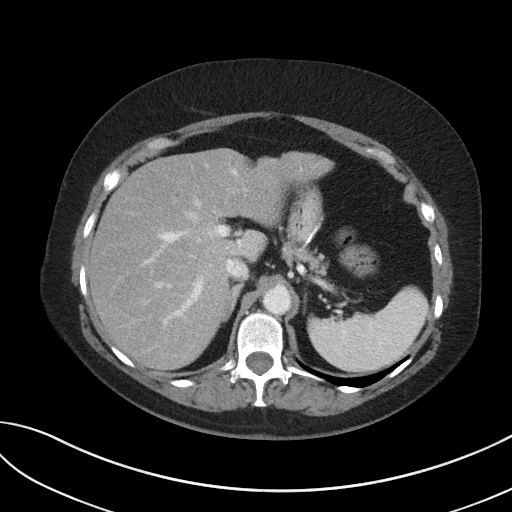
[im 79/84  soft-tissue]
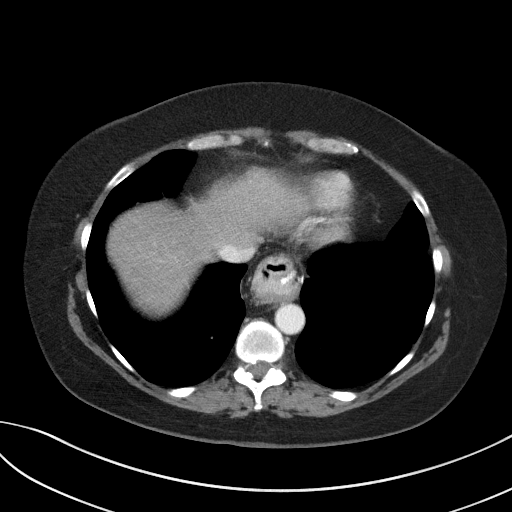

[Series 6: coronal st · coronal · 0.71mm/px · 3 of 89 slices shown]
[im 30/89  soft-tissue]
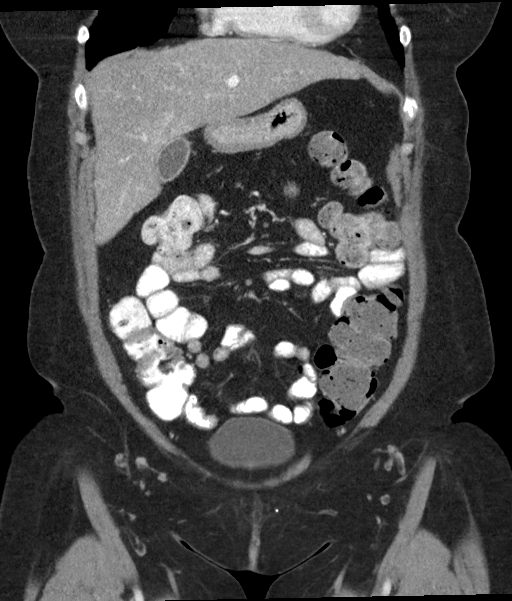
[im 40/89  soft-tissue]
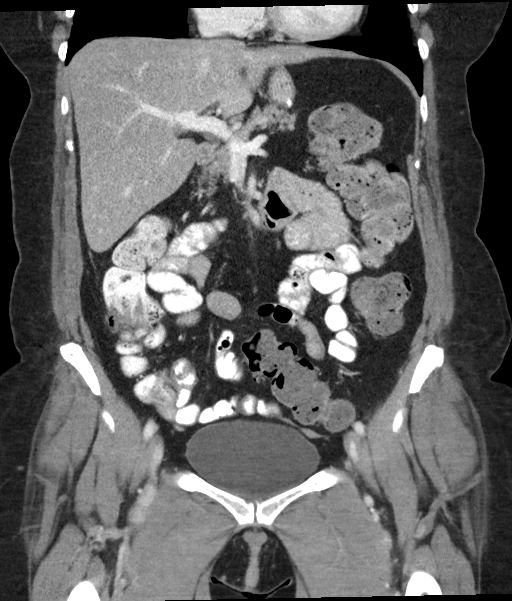
[im 49/89  soft-tissue]
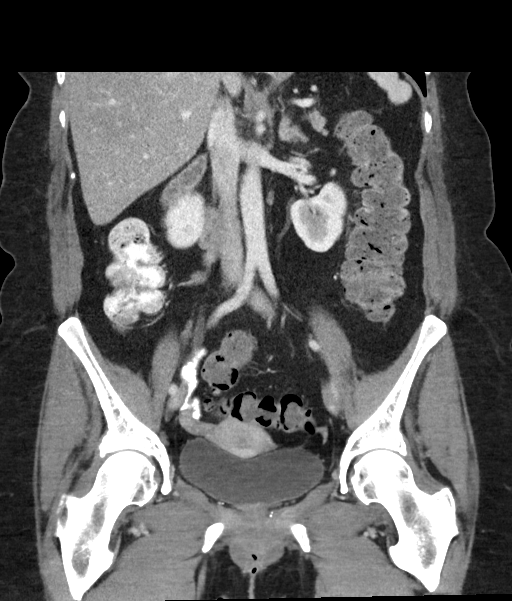

[15 of 46 positions shown; findings below may reference images not displayed]

FINDINGS: Lower chest: No acute abnormality.

Hepatobiliary: No gallstones or biliary dilatation is noted. 1.5 cm
ill-defined low density is noted in dome of right hepatic lobe.
Possible hepatic steatosis.

Pancreas: Unremarkable. No pancreatic ductal dilatation or
surrounding inflammatory changes.

Spleen: Normal in size without focal abnormality.

Adrenals/Urinary Tract: Adrenal glands appear normal. Left
nephrolithiasis is noted. No hydronephrosis or renal obstruction is
noted. Urinary bladder is unremarkable.

Stomach/Bowel: Status post gastric surgery. Status post
appendectomy. There is no evidence of bowel obstruction or
inflammation. Stool is noted throughout the colon.

Vascular/Lymphatic: No significant vascular findings are present. No
enlarged abdominal or pelvic lymph nodes.

Reproductive: Uterus and bilateral adnexa are unremarkable.

Other: No abdominal wall hernia or abnormality. No abdominopelvic
ascites.

Musculoskeletal: No acute or significant osseous findings.
IMPRESSION: 1. Left nephrolithiasis. No hydronephrosis or renal obstruction is
noted.
2. Possible hepatic steatosis.
3. 1.5 cm ill-defined low density is noted in dome of right hepatic
lobe. Further evaluation with MRI is recommended to evaluate for
possible hemangioma or neoplasm.

ADDENDUM:
At least 3 nonobstructive left renal calculi are noted. One measures
4 mm in the upper pole collecting system. Another measures 5 mm in
the lower pole collecting system. The smallest measures 3 mm in
upper pole collecting system.

*** End of Addendum ***
FINDINGS: Lower chest: No acute abnormality.

Hepatobiliary: No gallstones or biliary dilatation is noted. 1.5 cm
ill-defined low density is noted in dome of right hepatic lobe.
Possible hepatic steatosis.

Pancreas: Unremarkable. No pancreatic ductal dilatation or
surrounding inflammatory changes.

Spleen: Normal in size without focal abnormality.

Adrenals/Urinary Tract: Adrenal glands appear normal. Left
nephrolithiasis is noted. No hydronephrosis or renal obstruction is
noted. Urinary bladder is unremarkable.

Stomach/Bowel: Status post gastric surgery. Status post
appendectomy. There is no evidence of bowel obstruction or
inflammation. Stool is noted throughout the colon.

Vascular/Lymphatic: No significant vascular findings are present. No
enlarged abdominal or pelvic lymph nodes.

Reproductive: Uterus and bilateral adnexa are unremarkable.

Other: No abdominal wall hernia or abnormality. No abdominopelvic
ascites.

Musculoskeletal: No acute or significant osseous findings.
IMPRESSION: 1. Left nephrolithiasis. No hydronephrosis or renal obstruction is
noted.
2. Possible hepatic steatosis.
3. 1.5 cm ill-defined low density is noted in dome of right hepatic
lobe. Further evaluation with MRI is recommended to evaluate for
possible hemangioma or neoplasm.

## 2021-02-17 ENCOUNTER — Other Ambulatory Visit: Payer: Self-pay | Admitting: Nurse Practitioner

## 2021-02-17 ENCOUNTER — Encounter: Payer: Self-pay | Admitting: Nurse Practitioner

## 2021-02-17 DIAGNOSIS — K219 Gastro-esophageal reflux disease without esophagitis: Secondary | ICD-10-CM

## 2021-02-17 MED ORDER — PANTOPRAZOLE SODIUM 20 MG PO TBEC: 20.0000 mg | DELAYED_RELEASE_TABLET | Freq: Every day | ORAL | 4 refills | Status: DC

## 2021-02-17 NOTE — Progress Notes (Signed)
Sent new prescription to [facility] at [address]. It is 90 days with several refills

## 2021-03-20 ENCOUNTER — Encounter: Payer: 59 | Admitting: Nurse Practitioner

## 2021-03-28 ENCOUNTER — Other Ambulatory Visit: Payer: Self-pay | Admitting: Nurse Practitioner

## 2021-03-28 DIAGNOSIS — M064 Inflammatory polyarthropathy: Secondary | ICD-10-CM

## 2021-03-28 NOTE — Telephone Encounter (Signed)
Filled for 30 days. Last fill 12/2020. She will need to keep November appointment.

## 2021-03-31 ENCOUNTER — Encounter: Payer: Self-pay | Admitting: Nurse Practitioner

## 2021-05-09 ENCOUNTER — Ambulatory Visit (INDEPENDENT_AMBULATORY_CARE_PROVIDER_SITE_OTHER): Payer: 59 | Admitting: Nurse Practitioner

## 2021-05-09 ENCOUNTER — Encounter: Payer: Self-pay | Admitting: Nurse Practitioner

## 2021-05-09 VITALS — Ht 60.0 in | Wt 163.8 lb

## 2021-05-09 DIAGNOSIS — K219 Gastro-esophageal reflux disease without esophagitis: Secondary | ICD-10-CM | POA: Diagnosis not present

## 2021-05-09 DIAGNOSIS — M064 Inflammatory polyarthropathy: Secondary | ICD-10-CM

## 2021-05-09 DIAGNOSIS — F411 Generalized anxiety disorder: Secondary | ICD-10-CM

## 2021-05-09 DIAGNOSIS — I10 Essential (primary) hypertension: Secondary | ICD-10-CM | POA: Diagnosis not present

## 2021-05-09 DIAGNOSIS — Z6831 Body mass index (BMI) 31.0-31.9, adult: Secondary | ICD-10-CM | POA: Diagnosis not present

## 2021-05-09 DIAGNOSIS — J4520 Mild intermittent asthma, uncomplicated: Secondary | ICD-10-CM

## 2021-05-09 MED ORDER — PANTOPRAZOLE SODIUM 20 MG PO TBEC: 20.0000 mg | DELAYED_RELEASE_TABLET | Freq: Every day | ORAL | 4 refills | Status: DC

## 2021-05-09 MED ORDER — METFORMIN HCL 500 MG PO TABS: 500.0000 mg | ORAL_TABLET | Freq: Two times a day (BID) | ORAL | 2 refills | Status: DC

## 2021-05-09 MED ORDER — QUETIAPINE FUMARATE 200 MG PO TABS: 200.0000 mg | ORAL_TABLET | Freq: Every day | ORAL | 3 refills | Status: AC

## 2021-05-09 MED ORDER — TRAMADOL HCL 50 MG PO TABS: 50.0000 mg | ORAL_TABLET | Freq: Three times a day (TID) | ORAL | 2 refills | Status: DC | PRN

## 2021-05-09 NOTE — Progress Notes (Signed)
Virtual Visit via Telephone Note  I connected with [name] [name] on 05/09/2021 at 10:00 AM EST by telephone and verified that I am speaking with the correct person using two identifiers.  Location: Patient: home Provider: [place] [facility]     I discussed the limitations, risks, security and privacy concerns of performing an evaluation and management service by telephone and the availability of in person appointments. I also discussed with the patient that there may be a patient responsible charge related to this service. The patient expressed understanding and agreed to proceed.   History of Present Illness: Med refills needed  The patient has been taking tramadol 50 mg tablets up to 3 times daily when needed for low back pain.  She does have history of lumbar disc fusion.  No longer sees neurosurgeon. Indication for chronic opioid: lower back pain Medication and dose: tramadol 50mg  up to three times daily if needed  # pills per month: 90 Opioid Treatment Agreement signed (Y/N): yes Opioid Treatment Agreement last reviewed with patient:  yes [facility] reviewed this encounter (include red flags): Yes with no red flags  PDMP profile reviewed - overdose risk score is 220. Fill history is appropriate with last rx for tramadol on 04/03/2021.  She states she also needs refill for pantoprazole and metformin.  Feels that she is doing well with metformin.  Is curbing her appetite and she is noticeably losing inches.  Has no new concerns or complaints today.   Observations/Objective:  The patient is alert and oriented. She is pleasant and answers all questions appropriately. Breathing is non-labored. She is in no acute distress at this time.    Today's Vitals   05/09/21 1008  Weight: 163 lb 12.8 oz (74.3 kg)  Height: 5' (1.524 m)   Body mass index is 31.99 kg/m.   Assessment and Plan:  1. Essential hypertension Stable.  Continue blood pressure medication as  prescribed.  2. Mild intermittent asthma without complication Stable.  Continue medications for chronic allergies as well as inhalers for acute asthmatic responses.  3. Body mass index (BMI) of 31.0-31.9 in adult May continue metformin as previously prescribed.  Refills provided today. Discussed lowering calorie intake to 1500 calories per day and incorporating exercise into daily routine to help lose weight. Will monitor.  - metFORMIN (GLUCOPHAGE) 500 MG tablet; Take 1 tablet (500 mg total) by mouth 2 (two) times daily with a meal.  Dispense: 60 tablet; Refill: 2  4. Gastroesophageal reflux disease without esophagitis Continue pantoprazole as previously prescribed.  Refill provided today. - pantoprazole (PROTONIX) 20 MG tablet; Take 1 tablet (20 mg total) by mouth daily.  Dispense: 90 tablet; Refill: 4  5. Inflammatory polyarthritis ([place]) May continue to take tramadol up to 3 times daily if needed for pain.  New prescription was sent to her pharmacy. - traMADol (ULTRAM) 50 MG tablet; Take 1 tablet (50 mg total) by mouth 3 (three) times daily as needed.  Dispense: 90 tablet; Refill: 2  6. Generalized anxiety disorder Patient seeing a psychiatrist for generalized anxiety disorder.  Updated dose of Seroquel to reflect current dosing.  We will continue to see psychiatry. - QUEtiapine (SEROQUEL) 200 MG tablet; Take 1 tablet (200 mg total) by mouth at bedtime.  Dispense: 30 tablet; Refill: 3   Follow Up Instructions:    I discussed the assessment and treatment plan with the patient. The patient was provided an opportunity to ask questions and all were answered. The patient agreed with  the plan and demonstrated an understanding of the instructions.   The patient was advised to call back or seek an in-person evaluation if the symptoms worsen or if the condition fails to improve as anticipated.  I provided 10 minutes of non-face-to-face time during this encounter.  This note was dictated using  Systems analyst. Rapid proofreading was performed to expedite the delivery of the information. Despite proofreading, phonetic errors will occur which are common with this voice recognition software. Please take this into consideration. If there are any concerns, please contact our office.     Ronnell Freshwater, NP

## 2021-05-10 IMAGING — CR DG FACIAL BONES 1-2V
1 series · 2 of 2 positions shown · non-contrast
Comparison: None.

CLINICAL DATA: Fall, left periorbital and nasal swelling.

EXAM:
FACIAL BONES - 1-2 VIEW

[Series 1: dg facial bones 1-2 views · 0.14mm/px · 2 of 2 slices shown]
[im 1/2]
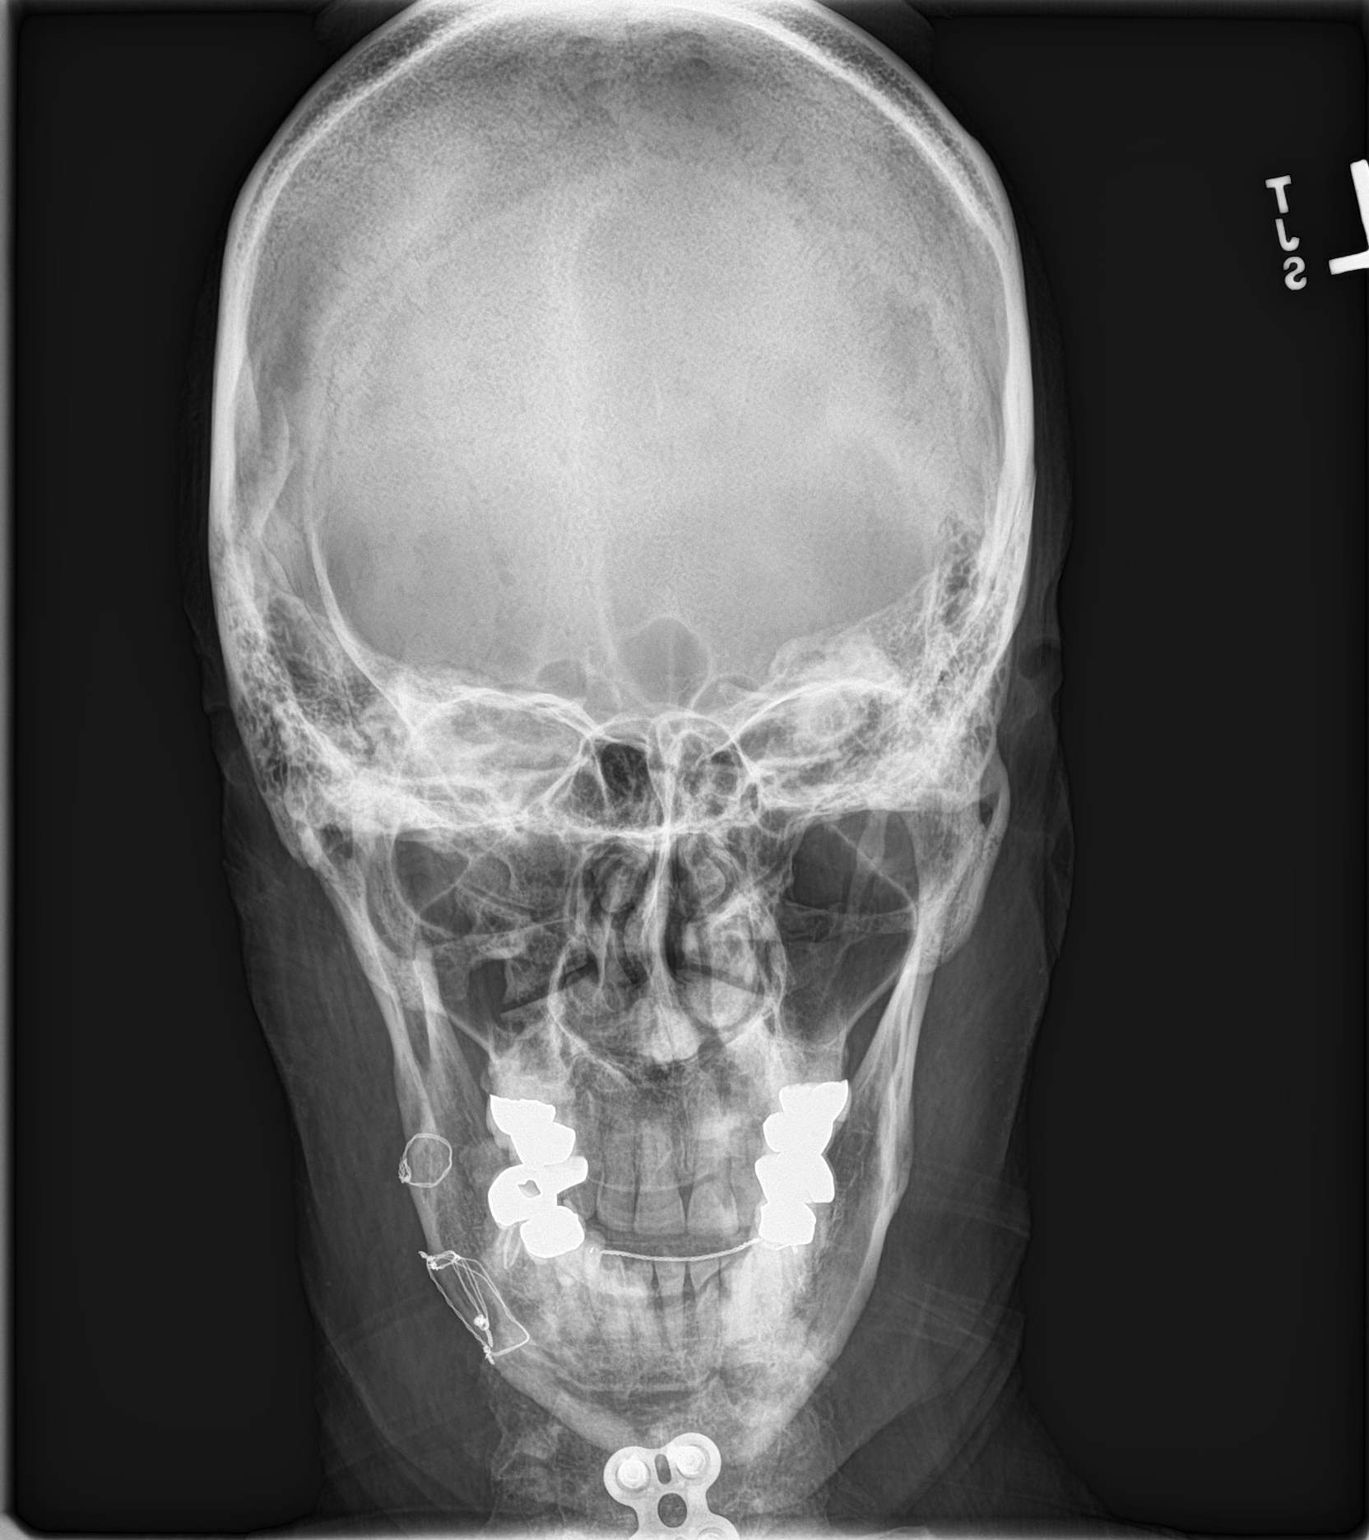
[im 2/2]
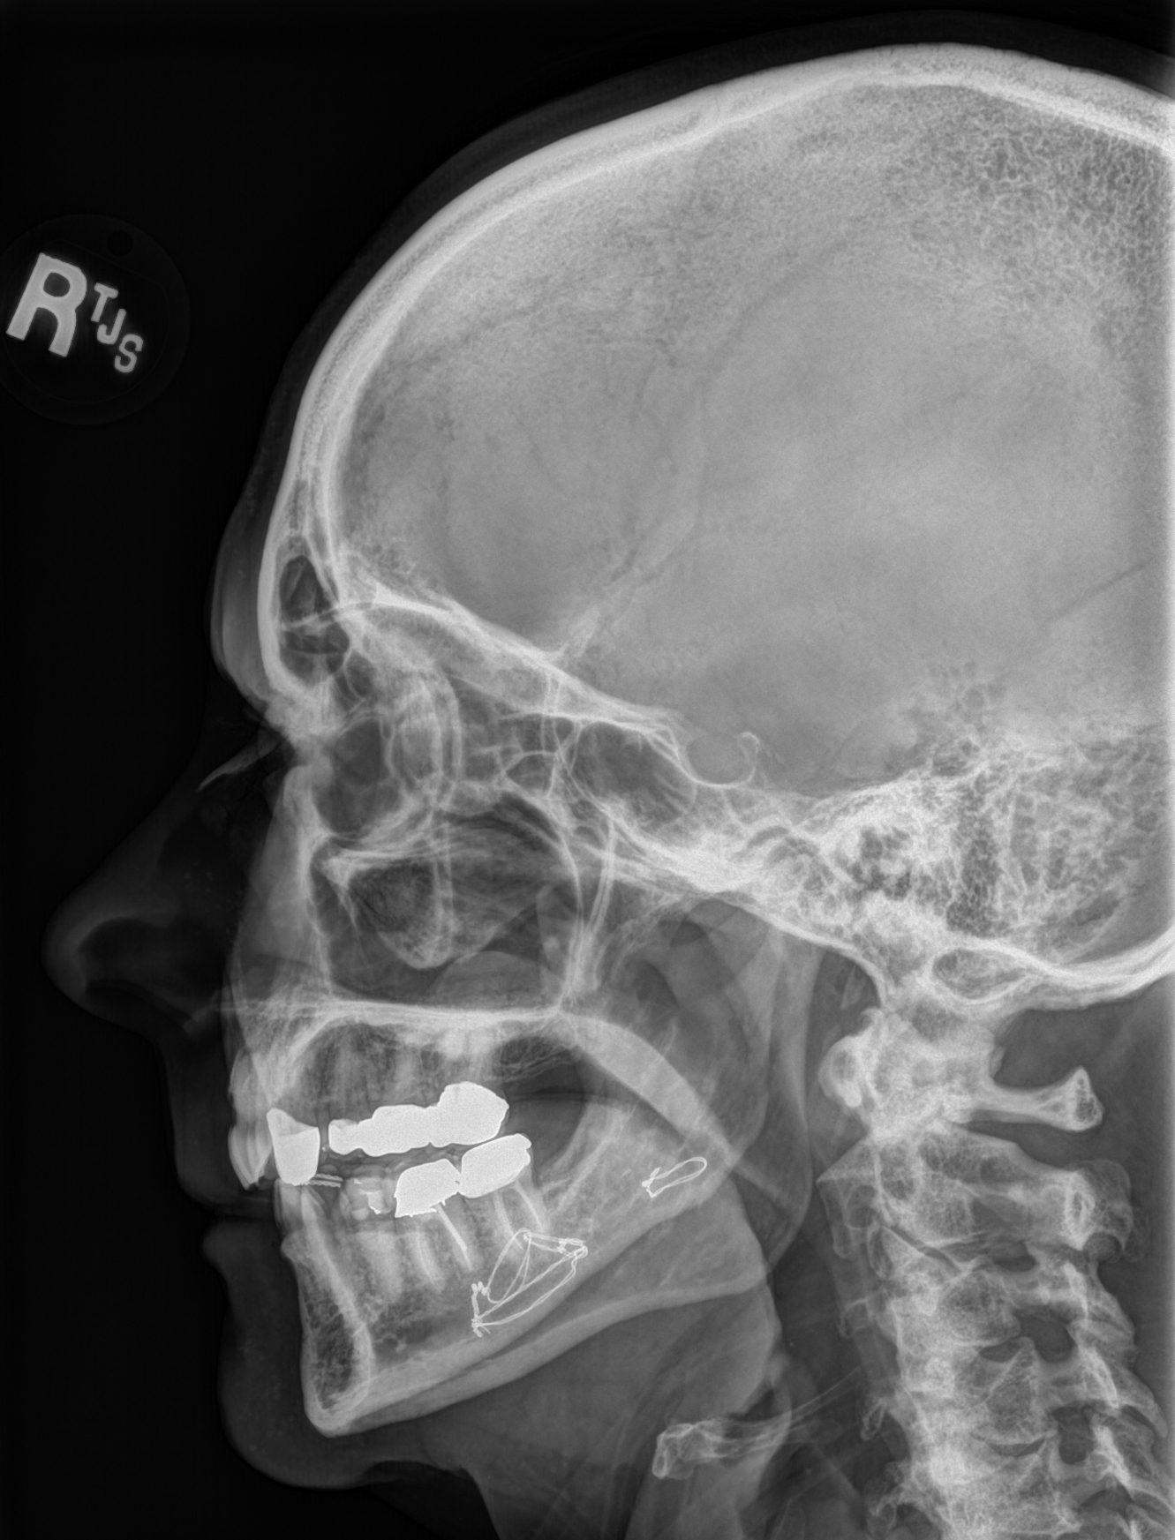

[2 of 2 positions shown; findings below may reference images not displayed]

FINDINGS: Two view radiograph of the facial bones demonstrates no definite
acute fracture or dislocation. Right mandibular ORIF has been
performed with cerclage wires noted within the a right mandibular
body. Extensive dental hardware is noted. Paranasal sinuses are
clear.
IMPRESSION: No acute facial fracture identified.

## 2021-05-18 DIAGNOSIS — F411 Generalized anxiety disorder: Secondary | ICD-10-CM | POA: Insufficient documentation

## 2021-06-04 ENCOUNTER — Other Ambulatory Visit: Payer: Self-pay | Admitting: Internal Medicine

## 2021-06-04 ENCOUNTER — Other Ambulatory Visit: Payer: Self-pay | Admitting: Nurse Practitioner

## 2021-06-04 DIAGNOSIS — G43909 Migraine, unspecified, not intractable, without status migrainosus: Secondary | ICD-10-CM

## 2021-06-04 DIAGNOSIS — I10 Essential (primary) hypertension: Secondary | ICD-10-CM

## 2021-06-04 DIAGNOSIS — Z6831 Body mass index (BMI) 31.0-31.9, adult: Secondary | ICD-10-CM

## 2021-06-04 DIAGNOSIS — J302 Other seasonal allergic rhinitis: Secondary | ICD-10-CM

## 2021-07-28 ENCOUNTER — Other Ambulatory Visit: Payer: Self-pay | Admitting: Internal Medicine

## 2021-07-28 DIAGNOSIS — G43909 Migraine, unspecified, not intractable, without status migrainosus: Secondary | ICD-10-CM

## 2021-08-11 ENCOUNTER — Other Ambulatory Visit: Payer: Self-pay | Admitting: Nurse Practitioner

## 2021-08-11 DIAGNOSIS — Z6831 Body mass index (BMI) 31.0-31.9, adult: Secondary | ICD-10-CM

## 2021-08-12 ENCOUNTER — Encounter: Payer: Self-pay | Admitting: Nurse Practitioner

## 2021-08-12 DIAGNOSIS — I10 Essential (primary) hypertension: Secondary | ICD-10-CM

## 2021-08-12 MED ORDER — VERAPAMIL HCL 40 MG PO TABS: ORAL_TABLET | ORAL | 0 refills | Status: DC

## 2021-08-17 ENCOUNTER — Other Ambulatory Visit: Payer: Self-pay | Admitting: Internal Medicine

## 2021-08-17 DIAGNOSIS — G43909 Migraine, unspecified, not intractable, without status migrainosus: Secondary | ICD-10-CM

## 2021-09-02 ENCOUNTER — Encounter: Payer: Self-pay | Admitting: Nurse Practitioner

## 2021-09-02 ENCOUNTER — Other Ambulatory Visit: Payer: Self-pay | Admitting: Nurse Practitioner

## 2021-09-02 DIAGNOSIS — M064 Inflammatory polyarthropathy: Secondary | ICD-10-CM

## 2021-09-02 MED ORDER — TRAMADOL HCL 50 MG PO TABS: 50.0000 mg | ORAL_TABLET | Freq: Three times a day (TID) | ORAL | 0 refills | Status: DC | PRN

## 2021-09-04 ENCOUNTER — Ambulatory Visit (INDEPENDENT_AMBULATORY_CARE_PROVIDER_SITE_OTHER): Payer: 59 | Admitting: Nurse Practitioner

## 2021-09-04 ENCOUNTER — Other Ambulatory Visit: Payer: Self-pay | Admitting: Nurse Practitioner

## 2021-09-04 ENCOUNTER — Encounter: Payer: Self-pay | Admitting: Nurse Practitioner

## 2021-09-04 VITALS — BP 116/66 | HR 60 | Temp 97.6°F | Ht 60.0 in | Wt 154.6 lb

## 2021-09-04 DIAGNOSIS — J3089 Other allergic rhinitis: Secondary | ICD-10-CM

## 2021-09-04 DIAGNOSIS — J302 Other seasonal allergic rhinitis: Secondary | ICD-10-CM

## 2021-09-04 DIAGNOSIS — I10 Essential (primary) hypertension: Secondary | ICD-10-CM

## 2021-09-04 DIAGNOSIS — G43909 Migraine, unspecified, not intractable, without status migrainosus: Secondary | ICD-10-CM

## 2021-09-04 DIAGNOSIS — Z6831 Body mass index (BMI) 31.0-31.9, adult: Secondary | ICD-10-CM

## 2021-09-04 DIAGNOSIS — M064 Inflammatory polyarthropathy: Secondary | ICD-10-CM

## 2021-09-04 DIAGNOSIS — Z6830 Body mass index (BMI) 30.0-30.9, adult: Secondary | ICD-10-CM

## 2021-09-04 MED ORDER — METFORMIN HCL 500 MG PO TABS: 500.0000 mg | ORAL_TABLET | Freq: Two times a day (BID) | ORAL | 1 refills | Status: DC

## 2021-09-04 MED ORDER — PROPRANOLOL HCL ER 120 MG PO CP24
120.0000 mg | ORAL_CAPSULE | Freq: Every day | ORAL | 1 refills | Status: DC
Start: 2021-09-04 — End: 2022-02-02

## 2021-09-04 MED ORDER — TRAMADOL HCL 50 MG PO TABS: 50.0000 mg | ORAL_TABLET | Freq: Three times a day (TID) | ORAL | 2 refills | Status: DC | PRN

## 2021-09-04 MED ORDER — MONTELUKAST SODIUM 10 MG PO TABS: ORAL_TABLET | ORAL | 1 refills | Status: DC

## 2021-09-04 MED ORDER — VERAPAMIL HCL 40 MG PO TABS: ORAL_TABLET | ORAL | 1 refills | Status: DC

## 2021-09-04 MED ORDER — RIZATRIPTAN BENZOATE 10 MG PO TABS: ORAL_TABLET | ORAL | 2 refills | Status: DC

## 2021-09-04 NOTE — Progress Notes (Signed)
Established patient visit ? ? ?Patient: [name] [name]   DOB: 10/09/1960   61 y.o. Female  MRN: [id number] ?Visit Date: 09/04/2021 ? ? ?Chief Complaint  ?Patient presents with  ? Medication Refill  ? ?Subjective  ?  ?HPI  ?The patient is here for routine follow up.   ?-The patient does suffer from chronic arthritic pain and lower back and legs.  She has had spinal fusion surgery several years ago.  Recovered well.  She does ride horses routinely.  She takes tramadol 50 mg tablets up to 3 times daily as needed for pain. Check of PDMP history was done today. Overdose risk score is 220. Refill history is appropriate. She does have a controlled substances policy on file for this office.  ?-blood pressure well managed.  ?-migraines doing well.  ?-currently taking metformin to help her obtain weight loss goals. Has lost nine pounds since most recent visit.  ?-sees psychiatry for moderate depression and generalized anxiety.  ? ?Medications: ?Outpatient Medications Prior to Visit  ?Medication Sig  ? albuterol (VENTOLIN HFA) 108 (90 Base) MCG/ACT inhaler Inhale 2 puffs into the lungs every 6 (six) hours as needed.  ? ALPRAZolam (XANAX) 0.5 MG tablet TK 1 T PO TID  ? ARIPiprazole (ABILIFY) 10 MG tablet   ? busPIRone (BUSPAR) 10 MG tablet TK 1 T PO BID  ? cetirizine (ZYRTEC) 10 MG tablet Take 1 tablet (10 mg total) by mouth daily.  ? clotrimazole (MYCELEX) 10 MG troche Take 1 tablet (10 mg total) by mouth 5 (five) times daily.  ? cyclobenzaprine (FLEXERIL) 10 MG tablet TAKE 1 TABLET(10 MG) BY MOUTH THREE TIMES DAILY AS NEEDED FOR MUSCLE SPASMS  ? Estradiol 10 MCG TABS vaginal tablet Insert 1 tab vaginally for 1 wk, then once wkly as maintenance  ? fluticasone-salmeterol (ADVAIR HFA) 115-21 MCG/ACT inhaler Inhale 2 puffs into the lungs 2 (two) times daily as needed.  ? gabapentin (NEURONTIN) 600 MG tablet   ? mometasone (NASONEX) 50 MCG/ACT nasal spray Place 2 sprays into the nose daily.  ? pantoprazole (PROTONIX) 20 MG tablet  Take 1 tablet (20 mg total) by mouth daily.  ? prazosin (MINIPRESS) 1 MG capsule Take 1 capsule po QD and 2 capsules po QPM  ? QUEtiapine (SEROQUEL) 200 MG tablet Take 1 tablet (200 mg total) by mouth at bedtime.  ? sertraline (ZOLOFT) 100 MG tablet Take 150 mg by mouth daily.  ? VITAMIN D, ERGOCALCIFEROL, PO Take by mouth. TAKE ONE TABLET TWICE PER WEEK  ? [DISCONTINUED] metFORMIN (GLUCOPHAGE) 500 MG tablet TAKE 1 TABLET BY MOUTH 2 TIMES DAILY WITH A MEAL.  ? [DISCONTINUED] montelukast (SINGULAIR) 10 MG tablet TAKE 1 TABLET BY MOUTH EVERYDAY AT BEDTIME  ? [DISCONTINUED] propranolol ER (INDERAL LA) 120 MG 24 hr capsule TAKE 1 CAPSULE (120 MG TOTAL) BY MOUTH AT BEDTIME.  ? [DISCONTINUED] rizatriptan (MAXALT) 10 MG tablet TAKE 1 TABLET BY MOUTH AS NEEDED FOR MIGRAINE. MAY REPEAT IN 2 HOURS AS NEEDED  ? [DISCONTINUED] traMADol (ULTRAM) 50 MG tablet Take 1 tablet (50 mg total) by mouth 3 (three) times daily as needed.  ? [DISCONTINUED] verapamil (CALAN) 40 MG tablet TAKE 1 TABLET BY MOUTH DAILY  ? ?No facility-administered medications prior to visit.  ? ? ?Review of Systems  ?Constitutional:  Negative for activity change, appetite change, chills, fatigue and fever.  ?     Nine pound weight loss since last visit .  ?HENT:  Negative for congestion, postnasal drip, rhinorrhea, sinus pressure, sinus pain,  sneezing and sore throat.   ?Eyes: Negative.   ?Respiratory:  Negative for cough, chest tightness, shortness of breath and wheezing.   ?     Asthma and allergies have been doing well.   ?Cardiovascular:  Negative for chest pain and palpitations.  ?Gastrointestinal:  Negative for abdominal pain, constipation, diarrhea, nausea and vomiting.  ?Endocrine: Negative for cold intolerance, heat intolerance, polydipsia and polyuria.  ?Genitourinary:  Negative for dyspareunia, dysuria, flank pain, frequency and urgency.  ?Musculoskeletal:  Positive for arthralgias, back pain and myalgias.  ?     Chronic low back pain with radiation  into both legs .  ?Skin:  Negative for rash.  ?Allergic/Immunologic: Positive for environmental allergies.  ?Neurological:  Positive for headaches. Negative for dizziness and weakness.  ?Hematological:  Negative for adenopathy.  ?Psychiatric/Behavioral:  Positive for dysphoric mood. The patient is nervous/anxious.   ? ? ? Objective  ?  ? ?Today's Vitals  ? 09/04/21 1435  ?BP: 116/66  ?Pulse: 60  ?Temp: 97.6 ?F (36.4 ?C)  ?SpO2: 98%  ?Weight: 154 lb 9.6 oz (70.1 kg)  ?Height: 5' (1.524 m)  ? ?Body mass index is 30.19 kg/m?.  ? ?BP Readings from Last 3 Encounters:  ?09/04/21 116/66  ?01/16/21 135/72  ?10/30/20 98/60  ?  ?Wt Readings from Last 3 Encounters:  ?09/04/21 154 lb 9.6 oz (70.1 kg)  ?05/09/21 163 lb 12.8 oz (74.3 kg)  ?01/16/21 163 lb 12.8 oz (74.3 kg)  ?  ?Physical Exam ?Vitals and nursing note reviewed.  ?Constitutional:   ?   Appearance: Normal appearance. She is well-developed.  ?HENT:  ?   Head: Normocephalic.  ?   Nose: Nose normal.  ?   Mouth/Throat:  ?   Mouth: Mucous membranes are moist.  ?   Pharynx: Oropharynx is clear.  ?Eyes:  ?   Extraocular Movements: Extraocular movements intact.  ?   Conjunctiva/sclera: Conjunctivae normal.  ?   Pupils: Pupils are equal, round, and reactive to light.  ?Neck:  ?   Vascular: No carotid bruit.  ?Cardiovascular:  ?   Rate and Rhythm: Normal rate and regular rhythm.  ?   Pulses: Normal pulses.  ?   Heart sounds: Normal heart sounds.  ?Pulmonary:  ?   Effort: Pulmonary effort is normal.  ?   Breath sounds: Normal breath sounds.  ?Abdominal:  ?   Palpations: Abdomen is soft.  ?Musculoskeletal:     ?   General: Normal range of motion.  ?   Cervical back: Normal range of motion and neck supple.  ?Lymphadenopathy:  ?   Cervical: No cervical adenopathy.  ?Skin: ?   General: Skin is warm and dry.  ?   Capillary Refill: Capillary refill takes less than 2 seconds.  ?Neurological:  ?   General: No focal deficit present.  ?   Mental Status: She is alert and oriented to  person, place, and time.  ?Psychiatric:     ?   Mood and Affect: Mood normal.     ?   Behavior: Behavior normal.     ?   Thought Content: Thought content normal.     ?   Judgment: Judgment normal.  ?  ? ? Assessment & Plan  ?  ?1. Essential hypertension ?Stable - continue propranolol ER and verapamil daily. Refills provided today  ?- verapamil (CALAN) 40 MG tablet; TAKE 1 TABLET BY MOUTH DAILY  Dispense: 90 tablet; Refill: 1 ?- propranolol ER (INDERAL LA) 120 MG 24 hr capsule;  Take 1 capsule (120 mg total) by mouth at bedtime.  Dispense: 90 capsule; Refill: 1 ? ?2. Inflammatory polyarthritis ([place]) ?Patient may take tramadol as previously prescribed. Refills sent to her pharmacy today.  ?- traMADol (ULTRAM) 50 MG tablet; Take 1 tablet (50 mg total) by mouth 3 (three) times daily as needed.  Dispense: 90 tablet; Refill: 2 ? ?3. Migraine without status migrainosus, not intractable, unspecified migraine type ?Continue propranolol ER daily. Use abortive therapy as needed and as prescribed  ?- propranolol ER (INDERAL LA) 120 MG 24 hr capsule; Take 1 capsule (120 mg total) by mouth at bedtime.  Dispense: 90 capsule; Refill: 1 ? ?4. Seasonal and perennial allergic rhinitis ?Renew singluair and continue to take daily.  ?- montelukast (SINGULAIR) 10 MG tablet; TAKE 1 TABLET BY MOUTH EVERYDAY AT BEDTIME  Dispense: 90 tablet; Refill: 1 ? ?5. BMI 30.0-30.9,adult ?Discussed lowering calorie intake to 1500 calories per day and incorporating exercise into daily routine to help lose weight. Continue metformin '500mg'$  twice daily to help with weight loss goals.  ?- metFORMIN (GLUCOPHAGE) 500 MG tablet; Take 1 tablet (500 mg total) by mouth 2 (two) times daily with a meal.  Dispense: 180 tablet; Refill: 1  ? ? ?Problem List Items Addressed This Visit   ? ?  ? Cardiovascular and Mediastinum  ? Migraine without status migrainosus, not intractable  ? Relevant Medications  ? verapamil (CALAN) 40 MG tablet  ? traMADol (ULTRAM) 50 MG tablet   ? propranolol ER (INDERAL LA) 120 MG 24 hr capsule  ? Essential hypertension - Primary  ? Relevant Medications  ? verapamil (CALAN) 40 MG tablet  ? propranolol ER (INDERAL LA) 120 MG 24 hr capsule  ?  ? R

## 2021-09-05 ENCOUNTER — Other Ambulatory Visit: Payer: Self-pay | Admitting: Nurse Practitioner

## 2021-09-05 DIAGNOSIS — G43909 Migraine, unspecified, not intractable, without status migrainosus: Secondary | ICD-10-CM

## 2021-09-05 MED ORDER — RIZATRIPTAN BENZOATE 10 MG PO TABS: ORAL_TABLET | ORAL | 5 refills | Status: DC

## 2021-09-05 NOTE — Progress Notes (Signed)
Changed quantity of maxalt to 10 tablets due to quantity limit.  ?

## 2021-09-23 ENCOUNTER — Encounter: Payer: Self-pay | Admitting: Nurse Practitioner

## 2021-09-23 ENCOUNTER — Encounter: Payer: Self-pay | Admitting: Obstetrics and Gynecology

## 2021-10-06 NOTE — Telephone Encounter (Signed)
Have you seen a prior authorization for this? I kept this message to look through all the requests I have and I have not seen it.

## 2021-11-02 ENCOUNTER — Other Ambulatory Visit: Payer: Self-pay | Admitting: Obstetrics and Gynecology

## 2021-11-02 DIAGNOSIS — N941 Unspecified dyspareunia: Secondary | ICD-10-CM

## 2021-11-02 DIAGNOSIS — N952 Postmenopausal atrophic vaginitis: Secondary | ICD-10-CM

## 2021-12-01 ENCOUNTER — Encounter: Payer: Self-pay | Admitting: Nurse Practitioner

## 2021-12-02 ENCOUNTER — Other Ambulatory Visit: Payer: Self-pay | Admitting: Obstetrics and Gynecology

## 2021-12-02 ENCOUNTER — Other Ambulatory Visit: Payer: Self-pay | Admitting: Nurse Practitioner

## 2021-12-02 DIAGNOSIS — N941 Unspecified dyspareunia: Secondary | ICD-10-CM

## 2021-12-02 DIAGNOSIS — I10 Essential (primary) hypertension: Secondary | ICD-10-CM

## 2021-12-02 DIAGNOSIS — N952 Postmenopausal atrophic vaginitis: Secondary | ICD-10-CM

## 2021-12-11 NOTE — Progress Notes (Signed)
Established patient visit   Patient: [name] [name]   DOB: 08/09/1960   61 y.o. Female  MRN: [id number] Visit Date: 12/12/2021   Chief Complaint  Patient presents with   Follow-up   Subjective    HPI  Follow up visit.  -most recent HgbA1c done 03/2020. Was 6.2.  -patient has been on metformin twice daily. Would like to try ozempic.  -due to have routine, fasting labs done  -concern for constipation as one side effect.  -migraine headaches  -hypertension.  -asthma/allergies.  -increased anxiety and depression due to loss of her dog yesterday.  -also changing jobs. Last day of current job is December 31, 2021. Doing pre-employment packet for new job.  -working with psychiatry to adjust medications to help anxiety and stress levels.     Medications: Outpatient Medications Prior to Visit  Medication Sig   albuterol (VENTOLIN HFA) 108 (90 Base) MCG/ACT inhaler Inhale 2 puffs into the lungs every 6 (six) hours as needed.   ALPRAZolam (XANAX) 0.5 MG tablet TK 1 T PO TID   ARIPiprazole (ABILIFY) 10 MG tablet    busPIRone (BUSPAR) 10 MG tablet TK 1 T PO BID   cetirizine (ZYRTEC) 10 MG tablet Take 1 tablet (10 mg total) by mouth daily.   clotrimazole (MYCELEX) 10 MG troche Take 1 tablet (10 mg total) by mouth 5 (five) times daily.   cyclobenzaprine (FLEXERIL) 10 MG tablet TAKE 1 TABLET(10 MG) BY MOUTH THREE TIMES DAILY AS NEEDED FOR MUSCLE SPASMS   Estradiol 10 MCG TABS vaginal tablet Insert 1 tab vaginally for 1 wk, then once wkly as maintenance   fluticasone-salmeterol (ADVAIR HFA) 115-21 MCG/ACT inhaler Inhale 2 puffs into the lungs 2 (two) times daily as needed.   gabapentin (NEURONTIN) 600 MG tablet    metFORMIN (GLUCOPHAGE) 500 MG tablet Take 1 tablet (500 mg total) by mouth 2 (two) times daily with a meal.   mometasone (NASONEX) 50 MCG/ACT nasal spray Place 2 sprays into the nose daily.   montelukast (SINGULAIR) 10 MG tablet TAKE 1 TABLET BY MOUTH EVERYDAY AT BEDTIME    pantoprazole (PROTONIX) 20 MG tablet Take 1 tablet (20 mg total) by mouth daily.   prazosin (MINIPRESS) 1 MG capsule Take 1 capsule po QD and 2 capsules po QPM   propranolol ER (INDERAL LA) 120 MG 24 hr capsule Take 1 capsule (120 mg total) by mouth at bedtime.   QUEtiapine (SEROQUEL) 200 MG tablet Take 1 tablet (200 mg total) by mouth at bedtime.   rizatriptan (MAXALT) 10 MG tablet Take 1 tablet po once at onset of migraine headache. May repeat dose in 2 hours if needed for persistent symptoms.   sertraline (ZOLOFT) 100 MG tablet Take 150 mg by mouth daily.   traMADol (ULTRAM) 50 MG tablet Take 1 tablet (50 mg total) by mouth 3 (three) times daily as needed.   verapamil (CALAN) 40 MG tablet TAKE 1 TABLET BY MOUTH EVERY DAY   VITAMIN D, ERGOCALCIFEROL, PO Take by mouth. TAKE ONE TABLET TWICE PER WEEK   No facility-administered medications prior to visit.    Review of Systems  Constitutional:  Positive for fatigue. Negative for activity change, appetite change, chills and fever.  HENT:  Negative for congestion, postnasal drip, rhinorrhea, sinus pressure, sinus pain, sneezing and sore throat.   Eyes: Negative.   Respiratory:  Negative for cough, chest tightness, shortness of breath and wheezing.   Cardiovascular:  Negative for chest pain and palpitations.  Gastrointestinal:  Negative for abdominal pain,  constipation, diarrhea, nausea and vomiting.  Endocrine: Negative for cold intolerance, heat intolerance, polydipsia and polyuria.  Genitourinary:  Negative for dyspareunia, dysuria, flank pain, frequency and urgency.  Musculoskeletal:  Negative for arthralgias, back pain and myalgias.  Skin:  Negative for rash.  Allergic/Immunologic: Positive for environmental allergies.  Neurological:  Positive for headaches. Negative for dizziness and weakness.  Hematological:  Negative for adenopathy.  Psychiatric/Behavioral:  Positive for decreased concentration. The patient is nervous/anxious.         Objective     Today's Vitals   12/12/21 0908  BP: 110/71  Pulse: 69  Temp: 97.7 F (36.5 C)  SpO2: 97%  Weight: 155 lb (70.3 kg)  Height: 5' (1.524 m)   Body mass index is 30.27 kg/m.   BP Readings from Last 3 Encounters:  12/12/21 110/71  09/04/21 116/66  01/16/21 135/72    Wt Readings from Last 3 Encounters:  12/12/21 155 lb (70.3 kg)  09/04/21 154 lb 9.6 oz (70.1 kg)  05/09/21 163 lb 12.8 oz (74.3 kg)    Physical Exam Vitals and nursing note reviewed.  Constitutional:      Appearance: Normal appearance. She is well-developed.  HENT:     Head: Normocephalic and atraumatic.  Eyes:     Pupils: Pupils are equal, round, and reactive to light.  Cardiovascular:     Rate and Rhythm: Normal rate and regular rhythm.     Pulses: Normal pulses.     Heart sounds: Normal heart sounds.  Pulmonary:     Effort: Pulmonary effort is normal.     Breath sounds: Normal breath sounds.  Abdominal:     Palpations: Abdomen is soft.  Musculoskeletal:        General: Normal range of motion.     Cervical back: Normal range of motion and neck supple.  Lymphadenopathy:     Cervical: No cervical adenopathy.  Skin:    General: Skin is warm and dry.     Capillary Refill: Capillary refill takes less than 2 seconds.  Neurological:     General: No focal deficit present.     Mental Status: She is alert and oriented to person, place, and time.  Psychiatric:        Mood and Affect: Mood normal.        Behavior: Behavior normal.        Thought Content: Thought content normal.        Judgment: Judgment normal.      Assessment & Plan    1. Prediabetes Check labs including CBC, CMP, and hemoglobin A1c.  Continue metformin as previously prescribed.  Add Ozempic 0.25 mg weekly.   - Hemoglobin A1c; Future - Comprehensive metabolic panel; Future - CBC; Future - Semaglutide,0.25 or 0.'5MG'$ /DOS, (OZEMPIC, 0.25 OR 0.5 MG/DOSE,) 2 MG/3ML SOPN; Inject 0.25-0.5 mg into the skin once a week.   Dispense: 3 mL; Refill: 1  2. Abnormal weight gain Check thyroid panel with routine labs. - TSH + free T4; Future  3. Essential hypertension Stable.  Continue medications as prescribed. - Lipid panel; Future - Comprehensive metabolic panel; Future - CBC; Future  4. Mixed hyperlipidemia Check fasting lipid panel.  Recommend patient limit intake of fried and fatty foods. She should increase intake of lean proteins and green leafy vegetables. Adding exercise into daily routine will also be beneficial.   - Lipid panel; Future - Comprehensive metabolic panel; Future - CBC; Future  5. Other fatigue Check thyroid panel for further evaluation. - TSH +  free T4; Future  6. Vitamin D deficiency Check vitamin D and treat deficiency as indicated. - VITAMIN D 25 Hydroxy (Vit-D Deficiency, Fractures); Future  7. BMI 30.0-30.9,adult Discussed lowering calorie intake to 1500 calories per day and incorporating exercise into daily routine to help lose weight.  Add Ozempic 0.25 mg weekly to help weight loss. - Semaglutide,0.25 or 0.'5MG'$ /DOS, (OZEMPIC, 0.25 OR 0.5 MG/DOSE,) 2 MG/3ML SOPN; Inject 0.25-0.5 mg into the skin once a week.  Dispense: 3 mL; Refill: 1  Problem List Items Addressed This Visit       Cardiovascular and Mediastinum   Essential hypertension   Relevant Orders   Lipid panel   Comprehensive metabolic panel   CBC     Other   Vitamin D deficiency   Relevant Orders   VITAMIN D 25 Hydroxy (Vit-D Deficiency, Fractures)   BMI 30.0-30.9,adult   Relevant Medications   Semaglutide,0.25 or 0.'5MG'$ /DOS, (OZEMPIC, 0.25 OR 0.5 MG/DOSE,) 2 MG/3ML SOPN   Other fatigue   Relevant Orders   TSH + free T4   Prediabetes - Primary   Relevant Medications   Semaglutide,0.25 or 0.'5MG'$ /DOS, (OZEMPIC, 0.25 OR 0.5 MG/DOSE,) 2 MG/3ML SOPN   Other Relevant Orders   Hemoglobin A1c   Comprehensive metabolic panel   CBC   Abnormal weight gain   Relevant Orders   TSH + free T4   Mixed  hyperlipidemia   Relevant Orders   Lipid panel   Comprehensive metabolic panel   CBC     Return in about 6 weeks (around 01/23/2022) for DM 2 - started ozempic. weaning down metformin .         Ronnell Freshwater, NP  [facility] [phone] (phone) [phone] (fax)  [place]

## 2021-12-12 ENCOUNTER — Ambulatory Visit (INDEPENDENT_AMBULATORY_CARE_PROVIDER_SITE_OTHER): Payer: 59 | Admitting: Nurse Practitioner

## 2021-12-12 ENCOUNTER — Encounter: Payer: Self-pay | Admitting: Nurse Practitioner

## 2021-12-12 ENCOUNTER — Other Ambulatory Visit: Payer: Self-pay

## 2021-12-12 VITALS — BP 110/71 | HR 69 | Temp 97.7°F | Ht 60.0 in | Wt 155.0 lb

## 2021-12-12 DIAGNOSIS — R635 Abnormal weight gain: Secondary | ICD-10-CM

## 2021-12-12 DIAGNOSIS — R5383 Other fatigue: Secondary | ICD-10-CM

## 2021-12-12 DIAGNOSIS — I10 Essential (primary) hypertension: Secondary | ICD-10-CM

## 2021-12-12 DIAGNOSIS — E782 Mixed hyperlipidemia: Secondary | ICD-10-CM | POA: Diagnosis not present

## 2021-12-12 DIAGNOSIS — E559 Vitamin D deficiency, unspecified: Secondary | ICD-10-CM

## 2021-12-12 DIAGNOSIS — R7303 Prediabetes: Secondary | ICD-10-CM | POA: Diagnosis not present

## 2021-12-12 DIAGNOSIS — Z6830 Body mass index (BMI) 30.0-30.9, adult: Secondary | ICD-10-CM

## 2021-12-12 MED ORDER — OZEMPIC (0.25 OR 0.5 MG/DOSE) 2 MG/3ML ~~LOC~~ SOPN: 0.2500 mg | PEN_INJECTOR | SUBCUTANEOUS | 1 refills | Status: DC

## 2021-12-12 NOTE — Patient Instructions (Signed)
Depression and Anxiety You will learn about mood disorders, how these conditions can occur in people with heart disease, and why it is important to treat these conditions. To view the content, go to this web address: [email or web address]  This video will expire on: 08/26/2023. If you need access to this video following this date, please reach out to the healthcare provider who assigned it to you. This information is not intended to replace advice given to you by your health care provider. Make sure you discuss any questions you have with your health care provider. Elsevier Patient Education  [employer].

## 2021-12-18 ENCOUNTER — Ambulatory Visit: Payer: 59 | Admitting: Nurse Practitioner

## 2021-12-19 ENCOUNTER — Encounter: Payer: Self-pay | Admitting: Nurse Practitioner

## 2021-12-21 DIAGNOSIS — R635 Abnormal weight gain: Secondary | ICD-10-CM | POA: Insufficient documentation

## 2021-12-21 DIAGNOSIS — E782 Mixed hyperlipidemia: Secondary | ICD-10-CM | POA: Insufficient documentation

## 2021-12-21 DIAGNOSIS — R7303 Prediabetes: Secondary | ICD-10-CM | POA: Insufficient documentation

## 2021-12-24 ENCOUNTER — Encounter: Payer: Self-pay | Admitting: Nurse Practitioner

## 2021-12-24 LAB — COMPREHENSIVE METABOLIC PANEL
ALT: 46 IU/L — ABNORMAL HIGH (ref 0–32)
AST: 31 IU/L (ref 0–40)
Albumin/Globulin Ratio: 1.7 (ref 1.2–2.2)
Albumin: 4.4 g/dL (ref 3.8–4.9)
Alkaline Phosphatase: 158 IU/L — ABNORMAL HIGH (ref 44–121)
BUN/Creatinine Ratio: 15 (ref 9–23)
BUN: 14 mg/dL (ref 6–24)
Bilirubin Total: 0.2 mg/dL (ref 0.0–1.2)
CO2: 29 mmol/L (ref 20–29)
Calcium: 9.9 mg/dL (ref 8.7–10.2)
Chloride: 101 mmol/L (ref 96–106)
Creatinine, Ser: 0.91 mg/dL (ref 0.57–1.00)
Globulin, Total: 2.6 g/dL (ref 1.5–4.5)
Glucose: 92 mg/dL (ref 70–99)
Potassium: 4.1 mmol/L (ref 3.5–5.2)
Sodium: 143 mmol/L (ref 134–144)
Total Protein: 7 g/dL (ref 6.0–8.5)
eGFR: 73 mL/min/{1.73_m2} (ref >59)

## 2021-12-24 LAB — LIPID PANEL
Chol/HDL Ratio: 2.8 ratio (ref 0.0–4.4)
Cholesterol, Total: 239 mg/dL — ABNORMAL HIGH (ref 100–199)
HDL: 84 mg/dL (ref >39)
LDL Chol Calc (NIH): 130 mg/dL — ABNORMAL HIGH (ref 0–99)
Triglycerides: 148 mg/dL (ref 0–149)
VLDL Cholesterol Cal: 25 mg/dL (ref 5–40)

## 2021-12-24 LAB — CBC
Hematocrit: 38.1 % (ref 34.0–46.6)
Hemoglobin: 12.7 g/dL (ref 11.1–15.9)
MCH: 28.8 pg (ref 26.6–33.0)
MCHC: 33.3 g/dL (ref 31.5–35.7)
MCV: 86 fL (ref 79–97)
Platelets: 296 10*3/uL (ref 150–450)
RBC: 4.41 x10E6/uL (ref 3.77–5.28)
RDW: 14 % (ref 11.7–15.4)
WBC: 8.6 10*3/uL (ref 3.4–10.8)

## 2021-12-24 LAB — HEMOGLOBIN A1C
Est. average glucose Bld gHb Est-mCnc: 117 mg/dL
Hgb A1c MFr Bld: 5.7 % — ABNORMAL HIGH (ref 4.8–5.6)

## 2021-12-24 LAB — VITAMIN D 25 HYDROXY (VIT D DEFICIENCY, FRACTURES): Vit D, 25-Hydroxy: 77.7 ng/mL (ref 30.0–100.0)

## 2021-12-24 LAB — TSH+FREE T4
Free T4: 0.89 ng/dL (ref 0.82–1.77)
TSH: 3.07 u[IU]/mL (ref 0.450–4.500)

## 2021-12-26 NOTE — Progress Notes (Signed)
Patient aware of lab results.

## 2022-01-02 ENCOUNTER — Other Ambulatory Visit: Payer: Self-pay | Admitting: Nurse Practitioner

## 2022-01-02 ENCOUNTER — Other Ambulatory Visit: Payer: Self-pay | Admitting: Obstetrics and Gynecology

## 2022-01-02 DIAGNOSIS — N941 Unspecified dyspareunia: Secondary | ICD-10-CM

## 2022-01-02 DIAGNOSIS — M064 Inflammatory polyarthropathy: Secondary | ICD-10-CM

## 2022-01-02 DIAGNOSIS — N952 Postmenopausal atrophic vaginitis: Secondary | ICD-10-CM

## 2022-01-05 ENCOUNTER — Encounter: Payer: Self-pay | Admitting: Nurse Practitioner

## 2022-01-05 ENCOUNTER — Other Ambulatory Visit: Payer: Self-pay | Admitting: Nurse Practitioner

## 2022-01-05 DIAGNOSIS — M064 Inflammatory polyarthropathy: Secondary | ICD-10-CM

## 2022-01-05 MED ORDER — TRAMADOL HCL 50 MG PO TABS: 50.0000 mg | ORAL_TABLET | Freq: Three times a day (TID) | ORAL | 2 refills | Status: DC | PRN

## 2022-01-05 NOTE — Progress Notes (Signed)
Reviewed PDMP profile. Overdose risk score is 190. Fill history is appropriate fill history. No red flags or state indicators. Refilled tramadol and sent to [facility] [address] in [place].

## 2022-01-07 ENCOUNTER — Other Ambulatory Visit: Payer: Self-pay | Admitting: Nurse Practitioner

## 2022-01-07 DIAGNOSIS — Z6830 Body mass index (BMI) 30.0-30.9, adult: Secondary | ICD-10-CM

## 2022-01-07 DIAGNOSIS — R7303 Prediabetes: Secondary | ICD-10-CM

## 2022-01-07 DIAGNOSIS — I10 Essential (primary) hypertension: Secondary | ICD-10-CM

## 2022-01-07 MED ORDER — RYBELSUS 3 MG PO TABS: 3.0000 mg | ORAL_TABLET | Freq: Every day | ORAL | 1 refills | Status: DC

## 2022-01-07 NOTE — Progress Notes (Signed)
Trial rybelsu 3 mg daily. Sent 30 day prescription with refill to [facility] [address].

## 2022-01-14 ENCOUNTER — Encounter (INDEPENDENT_AMBULATORY_CARE_PROVIDER_SITE_OTHER): Payer: Self-pay

## 2022-01-14 ENCOUNTER — Telehealth: Payer: 59 | Admitting: Physician Assistant

## 2022-01-14 DIAGNOSIS — N9419 Other specified dyspareunia: Secondary | ICD-10-CM

## 2022-01-14 NOTE — Progress Notes (Signed)
Because we do not evaluate or treat vaginal atrophy and dyspareunia via e-visit or virtual urgent care videos, I feel your condition warrants further evaluation and I recommend that you be seen for a face to face visit.  Please contact your primary care physician practice to be seen and to discuss starting premarin. Many offices offer virtual options to be seen via video if you are not comfortable going in person to a medical facility at this time.  NOTE: You will NOT be charged for this eVisit.  If you do not have a PCP, [facility] offers a free physician referral service available at [phone]. Our trained staff has the experience, knowledge and resources to put you in touch with a physician who is right for you.    If you are having a true medical emergency please call 911.   Your e-visit answers were reviewed by a board certified advanced clinical practitioner to complete your personal care plan.  Thank you for using e-Visits.

## 2022-01-16 ENCOUNTER — Ambulatory Visit: Payer: 59 | Admitting: Nurse Practitioner

## 2022-02-02 ENCOUNTER — Other Ambulatory Visit: Payer: Self-pay | Admitting: Obstetrics and Gynecology

## 2022-02-02 ENCOUNTER — Other Ambulatory Visit: Payer: Self-pay | Admitting: Nurse Practitioner

## 2022-02-02 DIAGNOSIS — N952 Postmenopausal atrophic vaginitis: Secondary | ICD-10-CM

## 2022-02-02 DIAGNOSIS — N941 Unspecified dyspareunia: Secondary | ICD-10-CM

## 2022-02-02 DIAGNOSIS — G43909 Migraine, unspecified, not intractable, without status migrainosus: Secondary | ICD-10-CM

## 2022-02-02 DIAGNOSIS — I10 Essential (primary) hypertension: Secondary | ICD-10-CM

## 2022-02-03 ENCOUNTER — Encounter: Payer: Self-pay | Admitting: Nurse Practitioner

## 2022-02-16 NOTE — Telephone Encounter (Signed)
It was denied.

## 2022-02-16 NOTE — Telephone Encounter (Signed)
Do you know if we got a prior authorization for this?

## 2022-03-01 ENCOUNTER — Other Ambulatory Visit: Payer: Self-pay | Admitting: Nurse Practitioner

## 2022-03-01 DIAGNOSIS — G43909 Migraine, unspecified, not intractable, without status migrainosus: Secondary | ICD-10-CM

## 2022-03-05 ENCOUNTER — Other Ambulatory Visit: Payer: Self-pay | Admitting: Obstetrics and Gynecology

## 2022-03-05 DIAGNOSIS — N952 Postmenopausal atrophic vaginitis: Secondary | ICD-10-CM

## 2022-03-05 DIAGNOSIS — N941 Unspecified dyspareunia: Secondary | ICD-10-CM

## 2022-04-05 ENCOUNTER — Other Ambulatory Visit: Payer: Self-pay | Admitting: Nurse Practitioner

## 2022-04-05 ENCOUNTER — Other Ambulatory Visit: Payer: Self-pay | Admitting: Obstetrics and Gynecology

## 2022-04-05 DIAGNOSIS — N941 Unspecified dyspareunia: Secondary | ICD-10-CM

## 2022-04-05 DIAGNOSIS — J302 Other seasonal allergic rhinitis: Secondary | ICD-10-CM

## 2022-04-05 DIAGNOSIS — N952 Postmenopausal atrophic vaginitis: Secondary | ICD-10-CM

## 2022-04-06 ENCOUNTER — Encounter: Payer: Self-pay | Admitting: Nurse Practitioner

## 2022-04-06 ENCOUNTER — Telehealth: Payer: Self-pay

## 2022-04-06 NOTE — Telephone Encounter (Signed)
Patient called office to get a refill  on her  traMADol, please advisem thanks!

## 2022-04-07 ENCOUNTER — Other Ambulatory Visit: Payer: Self-pay | Admitting: Nurse Practitioner

## 2022-04-07 DIAGNOSIS — M064 Inflammatory polyarthropathy: Secondary | ICD-10-CM

## 2022-04-07 MED ORDER — TRAMADOL HCL 50 MG PO TABS: 50.0000 mg | ORAL_TABLET | Freq: Three times a day (TID) | ORAL | 0 refills | Status: DC | PRN

## 2022-04-07 NOTE — Telephone Encounter (Signed)
Called pt she is advised of Rx that was sent

## 2022-04-07 NOTE — Telephone Encounter (Signed)
Please let her know that I filled tramadol for single 30 day prescription for her tramadol. She has follow up with me 04/13/2022.

## 2022-04-12 NOTE — Progress Notes (Unsigned)
Established patient visit   Patient: [name] [name]   DOB: 04-19-1961   61 y.o. Female  MRN: [id number] Visit Date: 04/13/2022   No chief complaint on file.  Subjective    HPI  Routine follow up -needs to  have medication refills today -takes tramadol as needed for back pain.  --history of spinal fusion surgery - chronic pain since  --PDMP profile reviewed 04/12/2022 --overdose risk score is 210 with no red flags or state  indicators.    Medications: Outpatient Medications Prior to Visit  Medication Sig   albuterol (VENTOLIN HFA) 108 (90 Base) MCG/ACT inhaler Inhale 2 puffs into the lungs every 6 (six) hours as needed.   ALPRAZolam (XANAX) 0.5 MG tablet TK 1 T PO TID   ARIPiprazole (ABILIFY) 10 MG tablet    busPIRone (BUSPAR) 10 MG tablet TK 1 T PO BID   cetirizine (ZYRTEC) 10 MG tablet Take 1 tablet (10 mg total) by mouth daily.   clotrimazole (MYCELEX) 10 MG troche Take 1 tablet (10 mg total) by mouth 5 (five) times daily.   cyclobenzaprine (FLEXERIL) 10 MG tablet TAKE 1 TABLET(10 MG) BY MOUTH THREE TIMES DAILY AS NEEDED FOR MUSCLE SPASMS   Estradiol 10 MCG TABS vaginal tablet Insert 1 tab vaginally for 1 wk, then once wkly as maintenance   fluticasone-salmeterol (ADVAIR HFA) 115-21 MCG/ACT inhaler Inhale 2 puffs into the lungs 2 (two) times daily as needed.   gabapentin (NEURONTIN) 600 MG tablet    metFORMIN (GLUCOPHAGE) 500 MG tablet Take 1 tablet (500 mg total) by mouth 2 (two) times daily with a meal.   mometasone (NASONEX) 50 MCG/ACT nasal spray Place 2 sprays into the nose daily.   montelukast (SINGULAIR) 10 MG tablet TAKE 1 TABLET BY MOUTH EVERYDAY AT BEDTIME   pantoprazole (PROTONIX) 20 MG tablet Take 1 tablet (20 mg total) by mouth daily.   prazosin (MINIPRESS) 1 MG capsule Take 1 capsule po QD and 2 capsules po QPM   propranolol ER (INDERAL LA) 120 MG 24 hr capsule TAKE 1 CAPSULE (120 MG TOTAL) BY MOUTH AT BEDTIME.   QUEtiapine (SEROQUEL) 200 MG tablet Take 1 tablet  (200 mg total) by mouth at bedtime.   rizatriptan (MAXALT) 10 MG tablet TAKE 1 TABLET PO ONCE AT ONSET OF MIGRAINE HEADACHE. MAY REPEAT DOSE IN 2 HOURS IF NEEDED FOR PERSISTENT SYMPTOMS.   Semaglutide (RYBELSUS) 3 MG TABS Take 3 mg by mouth daily.   sertraline (ZOLOFT) 100 MG tablet Take 150 mg by mouth daily.   traMADol (ULTRAM) 50 MG tablet Take 1 tablet (50 mg total) by mouth 3 (three) times daily as needed.   verapamil (CALAN) 40 MG tablet TAKE 1 TABLET BY MOUTH EVERY DAY   VITAMIN D, ERGOCALCIFEROL, PO Take by mouth. TAKE ONE TABLET TWICE PER WEEK   No facility-administered medications prior to visit.    Review of Systems  {Labs (Optional):23779}   Objective    There were no vitals filed for this visit. There is no height or weight on file to calculate BMI.  BP Readings from Last 3 Encounters:  12/12/21 110/71  09/04/21 116/66  01/16/21 135/72    Wt Readings from Last 3 Encounters:  12/12/21 155 lb (70.3 kg)  09/04/21 154 lb 9.6 oz (70.1 kg)  05/09/21 163 lb 12.8 oz (74.3 kg)    Physical Exam  ***  No results found for any visits on 04/13/22.  Assessment & Plan     Problem List Items Addressed This Visit  None    No follow-ups on file.         Ronnell Freshwater, NP  [facility] at [facility] [phone] (phone) [phone] (fax)  [place]

## 2022-04-13 ENCOUNTER — Ambulatory Visit (INDEPENDENT_AMBULATORY_CARE_PROVIDER_SITE_OTHER): Payer: 59 | Admitting: Nurse Practitioner

## 2022-04-13 ENCOUNTER — Encounter: Payer: Self-pay | Admitting: Nurse Practitioner

## 2022-04-13 VITALS — BP 122/80 | HR 64 | Ht 60.0 in | Wt 155.4 lb

## 2022-04-13 DIAGNOSIS — R7303 Prediabetes: Secondary | ICD-10-CM

## 2022-04-13 DIAGNOSIS — M064 Inflammatory polyarthropathy: Secondary | ICD-10-CM | POA: Diagnosis not present

## 2022-04-13 DIAGNOSIS — G43909 Migraine, unspecified, not intractable, without status migrainosus: Secondary | ICD-10-CM

## 2022-04-13 DIAGNOSIS — I10 Essential (primary) hypertension: Secondary | ICD-10-CM

## 2022-04-13 DIAGNOSIS — N941 Unspecified dyspareunia: Secondary | ICD-10-CM | POA: Diagnosis not present

## 2022-04-13 DIAGNOSIS — Z6830 Body mass index (BMI) 30.0-30.9, adult: Secondary | ICD-10-CM

## 2022-04-13 DIAGNOSIS — N9419 Other specified dyspareunia: Secondary | ICD-10-CM | POA: Insufficient documentation

## 2022-04-13 MED ORDER — TRAMADOL HCL 50 MG PO TABS: 50.0000 mg | ORAL_TABLET | Freq: Three times a day (TID) | ORAL | 2 refills | Status: DC | PRN

## 2022-04-13 MED ORDER — RYBELSUS 3 MG PO TABS: 3.0000 mg | ORAL_TABLET | Freq: Every day | ORAL | 2 refills | Status: DC

## 2022-04-13 MED ORDER — PREMARIN 0.625 MG/GM VA CREA: 1.0000 | TOPICAL_CREAM | Freq: Every day | VAGINAL | 12 refills | Status: DC

## 2022-04-14 ENCOUNTER — Other Ambulatory Visit: Payer: Self-pay | Admitting: Nurse Practitioner

## 2022-04-14 DIAGNOSIS — Z6830 Body mass index (BMI) 30.0-30.9, adult: Secondary | ICD-10-CM

## 2022-05-05 ENCOUNTER — Other Ambulatory Visit: Payer: Self-pay | Admitting: Nurse Practitioner

## 2022-05-05 DIAGNOSIS — G43909 Migraine, unspecified, not intractable, without status migrainosus: Secondary | ICD-10-CM

## 2022-05-05 DIAGNOSIS — I10 Essential (primary) hypertension: Secondary | ICD-10-CM

## 2022-05-07 ENCOUNTER — Encounter: Payer: Self-pay | Admitting: Nurse Practitioner

## 2022-05-07 DIAGNOSIS — K219 Gastro-esophageal reflux disease without esophagitis: Secondary | ICD-10-CM

## 2022-05-07 MED ORDER — PANTOPRAZOLE SODIUM 20 MG PO TBEC: 20.0000 mg | DELAYED_RELEASE_TABLET | Freq: Every day | ORAL | 0 refills | Status: DC

## 2022-05-11 ENCOUNTER — Other Ambulatory Visit: Payer: Self-pay | Admitting: Nurse Practitioner

## 2022-05-11 DIAGNOSIS — N941 Unspecified dyspareunia: Secondary | ICD-10-CM

## 2022-05-11 DIAGNOSIS — K219 Gastro-esophageal reflux disease without esophagitis: Secondary | ICD-10-CM

## 2022-05-11 DIAGNOSIS — H02729 Madarosis of unspecified eye, unspecified eyelid and periocular area: Secondary | ICD-10-CM

## 2022-05-11 MED ORDER — PREMARIN 0.625 MG/GM VA CREA: 1.0000 | TOPICAL_CREAM | Freq: Every day | VAGINAL | 12 refills | Status: AC

## 2022-05-11 MED ORDER — BIMATOPROST 0.03 % EX SOLN: 1.0000 [drp] | Freq: Every day | CUTANEOUS | 12 refills | Status: AC

## 2022-05-11 MED ORDER — PANTOPRAZOLE SODIUM 20 MG PO TBEC: 20.0000 mg | DELAYED_RELEASE_TABLET | Freq: Every day | ORAL | 3 refills | Status: DC

## 2022-05-31 ENCOUNTER — Other Ambulatory Visit: Payer: Self-pay | Admitting: Nurse Practitioner

## 2022-05-31 DIAGNOSIS — G43909 Migraine, unspecified, not intractable, without status migrainosus: Secondary | ICD-10-CM

## 2022-05-31 DIAGNOSIS — I10 Essential (primary) hypertension: Secondary | ICD-10-CM

## 2022-07-14 ENCOUNTER — Encounter: Payer: 59 | Admitting: Nurse Practitioner

## 2022-08-03 ENCOUNTER — Other Ambulatory Visit: Payer: Self-pay | Admitting: Nurse Practitioner

## 2022-08-03 DIAGNOSIS — I10 Essential (primary) hypertension: Secondary | ICD-10-CM

## 2022-08-03 DIAGNOSIS — M064 Inflammatory polyarthropathy: Secondary | ICD-10-CM

## 2022-08-03 DIAGNOSIS — G43909 Migraine, unspecified, not intractable, without status migrainosus: Secondary | ICD-10-CM

## 2022-08-03 NOTE — Telephone Encounter (Signed)
L.O.V: 04/13/22  N.O.V: 08/20/22  L.R.F:   04/13/22 Tramadol 90 tab 2 refill  06/02/22 Propranolol 90 cap 0 refill      Verapamil 90 0 refill

## 2022-08-20 ENCOUNTER — Ambulatory Visit (INDEPENDENT_AMBULATORY_CARE_PROVIDER_SITE_OTHER): Payer: 59 | Admitting: Nurse Practitioner

## 2022-08-20 ENCOUNTER — Telehealth: Payer: Self-pay | Admitting: *Deleted

## 2022-08-20 ENCOUNTER — Encounter: Payer: Self-pay | Admitting: Nurse Practitioner

## 2022-08-20 VITALS — BP 133/86 | HR 75 | Ht 60.0 in | Wt 151.4 lb

## 2022-08-20 DIAGNOSIS — F411 Generalized anxiety disorder: Secondary | ICD-10-CM

## 2022-08-20 DIAGNOSIS — I10 Essential (primary) hypertension: Secondary | ICD-10-CM

## 2022-08-20 DIAGNOSIS — R7303 Prediabetes: Secondary | ICD-10-CM

## 2022-08-20 DIAGNOSIS — N9419 Other specified dyspareunia: Secondary | ICD-10-CM

## 2022-08-20 DIAGNOSIS — Z6830 Body mass index (BMI) 30.0-30.9, adult: Secondary | ICD-10-CM

## 2022-08-20 DIAGNOSIS — E782 Mixed hyperlipidemia: Secondary | ICD-10-CM

## 2022-08-20 DIAGNOSIS — Z0000 Encounter for general adult medical examination without abnormal findings: Secondary | ICD-10-CM

## 2022-08-20 MED ORDER — RYBELSUS 7 MG PO TABS: 7.0000 mg | ORAL_TABLET | Freq: Every day | ORAL | 2 refills | Status: DC

## 2022-08-20 MED ORDER — DIAZEPAM 2 MG PO TABS: ORAL_TABLET | ORAL | 2 refills | Status: DC

## 2022-08-20 NOTE — Telephone Encounter (Signed)
Pt scheduled for 11/23/22 for her physical. Please upload any future labs that you will want and she will go to a [facility] location to have them drawn.

## 2022-08-20 NOTE — Progress Notes (Signed)
Established patient visit   Patient: [name] [name]   DOB: 1961/04/26   62 y.o. Female  MRN: [id number] Visit Date: 08/20/2022   Chief Complaint  Patient presents with   Medical Management of Chronic Issues   Subjective    HPI  Follow up  Routine follow up -needs to  have medication refills today -takes tramadol as needed for back pain.  --history of spinal fusion surgery - chronic pain since  --PDMP profile reviewed 08/20/2022 --overdose risk score is above average at 420 with no red flags or state indicators    -sees psychiatry  -GAD, depression, PTSD  Has prediabetes  --on metformin  --started prescription for rybelsus 3 mg daily.  -Recent labs showed HgbA1c at 6.0 with moderately elevated LFTs  -Has maintained her weight with current medications  -has some post menopausal vaginal atrophy and dyspareunia. Currently doing pelvic floor PT and was using estradiol vaginal inserts. Was recommended she do trial of premarin cream.      Medications: Outpatient Medications Prior to Visit  Medication Sig Note   albuterol (VENTOLIN HFA) 108 (90 Base) MCG/ACT inhaler Inhale 2 puffs into the lungs every 6 (six) hours as needed.    ALPRAZolam (XANAX) 0.5 MG tablet TK 1 T PO TID    ARIPiprazole (ABILIFY) 10 MG tablet     bimatoprost (LATISSE) 0.03 % ophthalmic solution Place 1 drop into both eyes at bedtime. Place one drop on applicator and apply evenly along the skin of the upper eyelid at base of eyelashes once daily at bedtime; repeat procedure for second eye (use a clean applicator).    busPIRone (BUSPAR) 10 MG tablet TK 1 T PO BID    cetirizine (ZYRTEC) 10 MG tablet Take 1 tablet (10 mg total) by mouth daily.    clotrimazole (MYCELEX) 10 MG troche Take 1 tablet (10 mg total) by mouth 5 (five) times daily.    conjugated estrogens (PREMARIN) vaginal cream Place 1 Applicatorful vaginally daily.    cyclobenzaprine (FLEXERIL) 10 MG tablet TAKE 1 TABLET(10 MG) BY MOUTH THREE TIMES  DAILY AS NEEDED FOR MUSCLE SPASMS    fluticasone-salmeterol (ADVAIR HFA) 115-21 MCG/ACT inhaler Inhale 2 puffs into the lungs 2 (two) times daily as needed.    gabapentin (NEURONTIN) 600 MG tablet     mometasone (NASONEX) 50 MCG/ACT nasal spray Place 2 sprays into the nose daily.    montelukast (SINGULAIR) 10 MG tablet TAKE 1 TABLET BY MOUTH EVERYDAY AT BEDTIME    pantoprazole (PROTONIX) 20 MG tablet Take 1 tablet (20 mg total) by mouth daily.    prazosin (MINIPRESS) 1 MG capsule Take 1 capsule po QD and 2 capsules po QPM    propranolol ER (INDERAL LA) 120 MG 24 hr capsule TAKE 1 CAPSULE (120 MG TOTAL) BY MOUTH AT BEDTIME.    QUEtiapine (SEROQUEL) 200 MG tablet Take 1 tablet (200 mg total) by mouth at bedtime.    rizatriptan (MAXALT) 10 MG tablet TAKE 1 TABLET PO ONCE AT ONSET OF MIGRAINE HEADACHE. MAY REPEAT DOSE IN 2 HOURS IF NEEDED FOR PERSISTENT SYMPTOMS.    sertraline (ZOLOFT) 100 MG tablet Take 150 mg by mouth daily.    traMADol (ULTRAM) 50 MG tablet TAKE 1 TABLET BY MOUTH 3 TIMES DAILY AS NEEDED.    verapamil (CALAN) 40 MG tablet TAKE 1 TABLET BY MOUTH EVERY DAY    VITAMIN D, ERGOCALCIFEROL, PO Take by mouth. TAKE ONE TABLET TWICE PER WEEK    [DISCONTINUED] metFORMIN (GLUCOPHAGE) 500 MG tablet TAKE 1  TABLET BY MOUTH 2 TIMES DAILY WITH A MEAL. 08/20/2022: elevated LFTs   [DISCONTINUED] Semaglutide (RYBELSUS) 3 MG TABS Take 3 mg by mouth daily.    No facility-administered medications prior to visit.    Review of Systems See HPI       Objective     Today's Vitals   08/20/22 1438  BP: 133/86  Pulse: 75  SpO2: 98%  Weight: 151 lb 6.4 oz (68.7 kg)  Height: 5' (1.524 m)   Body mass index is 29.57 kg/m.  BP Readings from Last 3 Encounters:  08/20/22 133/86  04/13/22 122/80  12/12/21 110/71    Wt Readings from Last 3 Encounters:  08/20/22 151 lb 6.4 oz (68.7 kg)  04/13/22 155 lb 6.4 oz (70.5 kg)  12/12/21 155 lb (70.3 kg)    Physical Exam Vitals and nursing note  reviewed.  Constitutional:      Appearance: Normal appearance. She is well-developed.  HENT:     Head: Normocephalic and atraumatic.     Nose: Nose normal.     Mouth/Throat:     Mouth: Mucous membranes are moist.     Pharynx: Oropharynx is clear.  Eyes:     Extraocular Movements: Extraocular movements intact.     Conjunctiva/sclera: Conjunctivae normal.     Pupils: Pupils are equal, round, and reactive to light.  Neck:     Vascular: No carotid bruit.  Cardiovascular:     Rate and Rhythm: Normal rate and regular rhythm.     Pulses: Normal pulses.     Heart sounds: Normal heart sounds.  Pulmonary:     Effort: Pulmonary effort is normal.     Breath sounds: Normal breath sounds.  Abdominal:     Palpations: Abdomen is soft.  Musculoskeletal:        General: Normal range of motion.     Cervical back: Normal range of motion and neck supple.  Lymphadenopathy:     Cervical: No cervical adenopathy.  Skin:    General: Skin is warm and dry.     Capillary Refill: Capillary refill takes less than 2 seconds.  Neurological:     General: No focal deficit present.     Mental Status: She is alert and oriented to person, place, and time.  Psychiatric:        Mood and Affect: Mood normal.        Behavior: Behavior normal.        Thought Content: Thought content normal.        Judgment: Judgment normal.      Assessment & Plan     Problem List Items Addressed This Visit       Cardiovascular and Mediastinum   Essential hypertension    Stable. Continue current medication.         Other   BMI 30.0-30.9,adult    Continue with low calorie, high-protein diet. Incorporate exercise into daily activities.        Generalized anxiety disorder    Currently stable. Continue regular visits with psychiatry as scheduled       Relevant Medications   diazepam (VALIUM) 2 MG tablet   Prediabetes - Primary    Recent HgbA1c 5.7 with normal fasting glucose. Increase Rybelsus to 7 mg daily. Will  increawse to 14 mg daily after a few weeks if tolerated wel.       Relevant Medications   Semaglutide (RYBELSUS) 7 MG TABS   Dyspareunia due to medical condition in female    Trial  valium 2 mg vaginally daily as needed. She continues to go through pelvic floor PT. Reassess at next visit.       Relevant Medications   diazepam (VALIUM) 2 MG tablet     Return in about 3 months (around 11/20/2022) for blood pressure, med refills, check HgbA1c and CMP prior to visit .         Carlean Jews, NP  [facility] at [facility] [phone] (phone) [phone] (fax)  [facility]

## 2022-08-20 NOTE — Telephone Encounter (Signed)
Labs have been ordered

## 2022-09-27 NOTE — Assessment & Plan Note (Signed)
Continue with low calorie, high-protein diet. Incorporate exercise into daily activities.   

## 2022-09-27 NOTE — Assessment & Plan Note (Signed)
Stable.  Continue current medication

## 2022-09-27 NOTE — Assessment & Plan Note (Signed)
Recent HgbA1c 5.7 with normal fasting glucose. Increase Rybelsus to 7 mg daily. Will increawse to 14 mg daily after a few weeks if tolerated wel.

## 2022-09-27 NOTE — Assessment & Plan Note (Addendum)
Trial valium 2 mg vaginally daily as needed. She continues to go through pelvic floor PT. Reassess at next visit.

## 2022-09-27 NOTE — Assessment & Plan Note (Signed)
Currently stable. Continue regular visits with psychiatry as scheduled

## 2022-10-02 ENCOUNTER — Other Ambulatory Visit: Payer: Self-pay | Admitting: Nurse Practitioner

## 2022-10-02 DIAGNOSIS — J302 Other seasonal allergic rhinitis: Secondary | ICD-10-CM

## 2022-10-02 DIAGNOSIS — M064 Inflammatory polyarthropathy: Secondary | ICD-10-CM

## 2022-10-05 NOTE — Telephone Encounter (Signed)
Pt called to request a refill on her tramadol.  I see it is already pending.

## 2022-11-04 ENCOUNTER — Ambulatory Visit: Payer: 59 | Admitting: Nurse Practitioner

## 2022-11-13 ENCOUNTER — Other Ambulatory Visit: Payer: Self-pay | Admitting: Nurse Practitioner

## 2022-11-13 ENCOUNTER — Encounter: Payer: Self-pay | Admitting: Nurse Practitioner

## 2022-11-13 DIAGNOSIS — Z201 Contact with and (suspected) exposure to tuberculosis: Secondary | ICD-10-CM

## 2022-11-17 ENCOUNTER — Encounter: Payer: Self-pay | Admitting: Nurse Practitioner

## 2022-11-17 ENCOUNTER — Other Ambulatory Visit
Admission: RE | Admit: 2022-11-17 | Discharge: 2022-11-17 | Disposition: A | Discharge status: Home / Self Care | Payer: 59 | Source: Ambulatory Visit | Attending: Nurse Practitioner | Admitting: Nurse Practitioner

## 2022-11-17 ENCOUNTER — Other Ambulatory Visit: Payer: Self-pay

## 2022-11-17 DIAGNOSIS — Z201 Contact with and (suspected) exposure to tuberculosis: Secondary | ICD-10-CM | POA: Diagnosis present

## 2022-11-17 DIAGNOSIS — Z0000 Encounter for general adult medical examination without abnormal findings: Secondary | ICD-10-CM

## 2022-11-18 LAB — LIPID PANEL
Chol/HDL Ratio: 3.1 ratio (ref 0.0–4.4)
Cholesterol, Total: 220 mg/dL — ABNORMAL HIGH (ref 100–199)
HDL: 71 mg/dL (ref >39)
LDL Chol Calc (NIH): 122 mg/dL — ABNORMAL HIGH (ref 0–99)
Triglycerides: 154 mg/dL — ABNORMAL HIGH (ref 0–149)
VLDL Cholesterol Cal: 27 mg/dL (ref 5–40)

## 2022-11-18 LAB — COMPREHENSIVE METABOLIC PANEL
ALT: 43 IU/L — ABNORMAL HIGH (ref 0–32)
AST: 35 IU/L (ref 0–40)
Albumin/Globulin Ratio: 1.9
Albumin: 4.3 g/dL (ref 3.8–4.9)
Alkaline Phosphatase: 139 IU/L — ABNORMAL HIGH (ref 44–121)
BUN/Creatinine Ratio: 16 (ref 9–23)
BUN: 11 mg/dL (ref 6–24)
Bilirubin Total: <0.2 mg/dL (ref 0.0–1.2)
CO2: 26 mmol/L (ref 20–29)
Calcium: 9.6 mg/dL (ref 8.7–10.2)
Chloride: 103 mmol/L (ref 96–106)
Creatinine, Ser: 0.69 mg/dL (ref 0.57–1.00)
Globulin, Total: 2.3 g/dL (ref 1.5–4.5)
Glucose: 95 mg/dL (ref 70–99)
Potassium: 4.3 mmol/L (ref 3.5–5.2)
Sodium: 143 mmol/L (ref 134–144)
Total Protein: 6.6 g/dL (ref 6.0–8.5)
eGFR: 100 mL/min/{1.73_m2} (ref >59)

## 2022-11-18 LAB — CBC
Hematocrit: 36.2 % (ref 34.0–46.6)
Hemoglobin: 11.5 g/dL (ref 11.1–15.9)
MCH: 27.1 pg (ref 26.6–33.0)
MCHC: 31.8 g/dL (ref 31.5–35.7)
MCV: 85 fL (ref 79–97)
Platelets: 330 10*3/uL (ref 150–450)
RBC: 4.24 x10E6/uL (ref 3.77–5.28)
RDW: 13.9 % (ref 11.7–15.4)
WBC: 8 10*3/uL (ref 3.4–10.8)

## 2022-11-18 LAB — HEMOGLOBIN A1C
Est. average glucose Bld gHb Est-mCnc: 123 mg/dL
Hgb A1c MFr Bld: 5.9 % — ABNORMAL HIGH (ref 4.8–5.6)

## 2022-11-18 LAB — TSH: TSH: 1.6 u[IU]/mL (ref 0.450–4.500)

## 2022-11-21 LAB — QUANTIFERON-TB GOLD PLUS (RQFGPL)
QuantiFERON Mitogen Value: >10 IU/mL
QuantiFERON Nil Value: 0.04 IU/mL
QuantiFERON TB1 Ag Value: 0.02 IU/mL
QuantiFERON TB2 Ag Value: 0.03 IU/mL

## 2022-11-21 LAB — QUANTIFERON-TB GOLD PLUS: QuantiFERON-TB Gold Plus: NEGATIVE

## 2022-11-23 ENCOUNTER — Encounter: Payer: 59 | Admitting: Nurse Practitioner
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
## 2022-11-25 ENCOUNTER — Ambulatory Visit (INDEPENDENT_AMBULATORY_CARE_PROVIDER_SITE_OTHER): Payer: 59 | Admitting: Family Medicine

## 2022-11-25 ENCOUNTER — Encounter: Payer: Self-pay | Admitting: Family Medicine

## 2022-11-25 VITALS — BP 103/76 | HR 70 | Temp 97.7°F | Ht 60.0 in | Wt 153.0 lb

## 2022-11-25 DIAGNOSIS — G43909 Migraine, unspecified, not intractable, without status migrainosus: Secondary | ICD-10-CM

## 2022-11-25 DIAGNOSIS — J3089 Other allergic rhinitis: Secondary | ICD-10-CM

## 2022-11-25 DIAGNOSIS — E782 Mixed hyperlipidemia: Secondary | ICD-10-CM

## 2022-11-25 DIAGNOSIS — R7303 Prediabetes: Secondary | ICD-10-CM

## 2022-11-25 DIAGNOSIS — F411 Generalized anxiety disorder: Secondary | ICD-10-CM

## 2022-11-25 DIAGNOSIS — Z0000 Encounter for general adult medical examination without abnormal findings: Secondary | ICD-10-CM

## 2022-11-25 DIAGNOSIS — G8929 Other chronic pain: Secondary | ICD-10-CM

## 2022-11-25 DIAGNOSIS — R229 Localized swelling, mass and lump, unspecified: Secondary | ICD-10-CM | POA: Insufficient documentation

## 2022-11-25 DIAGNOSIS — I10 Essential (primary) hypertension: Secondary | ICD-10-CM

## 2022-11-25 DIAGNOSIS — M064 Inflammatory polyarthropathy: Secondary | ICD-10-CM

## 2022-11-25 DIAGNOSIS — N9419 Other specified dyspareunia: Secondary | ICD-10-CM

## 2022-11-25 DIAGNOSIS — M5440 Lumbago with sciatica, unspecified side: Secondary | ICD-10-CM

## 2022-11-25 DIAGNOSIS — J302 Other seasonal allergic rhinitis: Secondary | ICD-10-CM

## 2022-11-25 MED ORDER — PROPRANOLOL HCL ER 120 MG PO CP24
120.0000 mg | ORAL_CAPSULE | Freq: Every day | ORAL | 1 refills | Status: DC
Start: 2022-11-25 — End: 2023-05-10

## 2022-11-25 MED ORDER — CYCLOBENZAPRINE HCL 10 MG PO TABS
ORAL_TABLET | ORAL | 0 refills | Status: DC
Start: 2022-11-25 — End: 2022-12-31

## 2022-11-25 MED ORDER — MONTELUKAST SODIUM 10 MG PO TABS
ORAL_TABLET | ORAL | 1 refills | Status: DC
Start: 2022-11-25 — End: 2023-05-10

## 2022-11-25 MED ORDER — DIAZEPAM 2 MG PO TABS
ORAL_TABLET | ORAL | 2 refills | Status: DC
Start: 2022-11-25 — End: 2023-05-12

## 2022-11-25 MED ORDER — TRAMADOL HCL 50 MG PO TABS
50.0000 mg | ORAL_TABLET | Freq: Three times a day (TID) | ORAL | 1 refills | Status: DC | PRN
Start: 2022-11-25 — End: 2023-02-01

## 2022-11-25 MED ORDER — RYBELSUS 14 MG PO TABS: 14.0000 mg | ORAL_TABLET | Freq: Every day | ORAL | 1 refills | Status: DC

## 2022-11-25 MED ORDER — VERAPAMIL HCL 40 MG PO TABS: 40.0000 mg | ORAL_TABLET | Freq: Every day | ORAL | 1 refills | Status: DC

## 2022-11-25 NOTE — Assessment & Plan Note (Signed)
Both the left anterior shoulder and right anterior thigh mass.  Most consistent with lipoma.  Could consider simple cyst as well.  Discussed with the patient in she would prefer to monitor it now and if it becomes concerning or changes in the character or size will opt for ultrasound imaging.

## 2022-11-25 NOTE — Progress Notes (Signed)
Established Patient Office Visit  Subjective   Patient ID: [name] [name], female    DOB: Aug 01, 1960  Age: 62 y.o. MRN: [id number]  Chief Complaint  Patient presents with   Follow-up    HPI  Prediabetes -patient tolerating her Rybelsus.  States that she lost 7 pounds on it, and then gained some back when there was some stressful events going on in her life involving her husband's health.  Patient okay with increasing to 14 mg.  Patient has stopped taking her metformin after discussion with her provider.  Patient had a mammogram earlier this year.  Patient has a small lump on her left shoulder as well as right thigh that she just noticed recently.  It is nonpainful.  Patient today tolerating all her medications well.  Needs refills on the medications prescribed through his office.  No changes desired.  The 10-year ASCVD risk score (Arnett DK, et al., 2019) is: 2.3%     ROS    Objective:     BP 103/76   Pulse 70   Temp 97.7 F (36.5 C) (Oral)   Ht 5' (1.524 m)   Wt 153 lb (69.4 kg)   LMP 10/23/2011   SpO2 100%   BMI 29.88 kg/m    Physical Exam General: Alert, oriented CV: Regular rate and rhythm Pulmonary: Lungs clear bilaterally GI: Soft, nontender MSK strength equal bilaterally Skin: Small well-circumscribed subcutaneous mass on the anterior left shoulder as well as the right anterior thigh, both similar in size and shape    No results found for any visits on 11/25/22.        Assessment & Plan:   Wellness examination  Essential hypertension -     Propranolol HCl ER; Take 1 capsule (120 mg total) by mouth at bedtime.  Dispense: 90 capsule; Refill: 1 -     Verapamil HCl; Take 1 tablet (40 mg total) by mouth daily.  Dispense: 90 tablet; Refill: 1  Mixed hyperlipidemia  Prediabetes Assessment & Plan: Increased rybelsus to 14mg    Generalized anxiety disorder  Seasonal and perennial allergic rhinitis -     Montelukast Sodium; TAKE 1 TABLET  BY MOUTH EVERYDAY AT BEDTIME  Dispense: 90 tablet; Refill: 1  Migraine without status migrainosus, not intractable, unspecified migraine type -     Propranolol HCl ER; Take 1 capsule (120 mg total) by mouth at bedtime.  Dispense: 90 capsule; Refill: 1  Dyspareunia due to medical condition in female -     diazePAM; Insert 1 tablet vaginally QD prn  Dispense: 15 tablet; Refill: 2  Chronic low back pain with sciatica, sciatica laterality unspecified, unspecified back pain laterality -     Cyclobenzaprine HCl; TAKE 1 TABLET(10 MG) BY MOUTH THREE TIMES DAILY AS NEEDED FOR MUSCLE SPASMS  Dispense: 270 tablet; Refill: 0  Inflammatory polyarthritis (HCC) -     traMADol HCl; Take 1 tablet (50 mg total) by mouth 3 (three) times daily as needed.  Dispense: 90 tablet; Refill: 1  Subcutaneous mass Assessment & Plan: Both the left anterior shoulder and right anterior thigh mass.  Most consistent with lipoma.  Could consider simple cyst as well.  Discussed with the patient in she would prefer to monitor it now and if it becomes concerning or changes in the character or size will opt for ultrasound imaging.   Other orders -     Rybelsus; Take 1 tablet (14 mg total) by mouth daily.  Dispense: 90 tablet; Refill: 1     Return  in about 4 months (around 03/27/2023) for DM.    Sandre Kitty, MD

## 2022-11-25 NOTE — Patient Instructions (Signed)
It was nice to see you today,  We addressed the following topics today: I have refilled your medications - I have increased the dose of rybelsus to 14mg .  - please follow up in 4 months  Have a great day,  Frederic Jericho, MD

## 2022-11-27 ENCOUNTER — Other Ambulatory Visit: Payer: Self-pay | Admitting: Nurse Practitioner

## 2022-11-27 DIAGNOSIS — R7303 Prediabetes: Secondary | ICD-10-CM

## 2022-11-27 NOTE — Assessment & Plan Note (Signed)
Increased rybelsus to 14mg 

## 2022-12-07 NOTE — Progress Notes (Signed)
Lab results reviewed with patient.

## 2022-12-31 ENCOUNTER — Other Ambulatory Visit: Payer: Self-pay | Admitting: Family Medicine

## 2022-12-31 DIAGNOSIS — G8929 Other chronic pain: Secondary | ICD-10-CM

## 2023-01-31 ENCOUNTER — Other Ambulatory Visit: Payer: Self-pay | Admitting: Nurse Practitioner

## 2023-01-31 ENCOUNTER — Encounter: Payer: Self-pay | Admitting: Family Medicine

## 2023-01-31 DIAGNOSIS — K219 Gastro-esophageal reflux disease without esophagitis: Secondary | ICD-10-CM

## 2023-02-01 ENCOUNTER — Other Ambulatory Visit: Payer: Self-pay | Admitting: Family Medicine

## 2023-02-01 DIAGNOSIS — M064 Inflammatory polyarthropathy: Secondary | ICD-10-CM

## 2023-02-01 MED ORDER — TRAMADOL HCL 50 MG PO TABS
50.0000 mg | ORAL_TABLET | Freq: Three times a day (TID) | ORAL | 1 refills | Status: DC | PRN
Start: 2023-02-01 — End: 2023-05-12

## 2023-02-18 ENCOUNTER — Encounter: Payer: 59 | Admitting: Nurse Practitioner

## 2023-03-29 ENCOUNTER — Ambulatory Visit: Payer: BC Managed Care – PPO | Admitting: Family Medicine

## 2023-03-29 ENCOUNTER — Other Ambulatory Visit: Payer: Self-pay | Admitting: Family Medicine

## 2023-03-29 ENCOUNTER — Ambulatory Visit (INDEPENDENT_AMBULATORY_CARE_PROVIDER_SITE_OTHER): Payer: BC Managed Care – PPO | Admitting: Family Medicine

## 2023-03-29 ENCOUNTER — Encounter: Payer: Self-pay | Admitting: Family Medicine

## 2023-03-29 VITALS — BP 107/71 | HR 80 | Ht 60.0 in | Wt 155.4 lb

## 2023-03-29 DIAGNOSIS — Z79899 Other long term (current) drug therapy: Secondary | ICD-10-CM | POA: Diagnosis not present

## 2023-03-29 DIAGNOSIS — R7303 Prediabetes: Secondary | ICD-10-CM

## 2023-03-29 MED ORDER — WEGOVY 0.5 MG/0.5ML ~~LOC~~ SOAJ: 0.5000 mg | SUBCUTANEOUS | 2 refills | Status: DC

## 2023-03-29 NOTE — Progress Notes (Unsigned)
   Established Patient Office Visit  Subjective   Patient ID: [name] [name], female    DOB: 30-Mar-1961  Age: 62 y.o. MRN: [id number]  No chief complaint on file.   HPI  Prediabetes-Rybelsus.  Increase to 14 mg?  Diazepam as vaginal?  Dr. Margretta Sidle Dr. Georgian Co notes.  Sertraline 100 mg.  150 mg?  Seroquel 400 mg daily, prazosin 3 mg, gabapentin 600 mg, Xanax 0.5 3 times daily, BuSpar 10 mg, aripiprazole 10 mg  Pain?  Cyclobenzaprine 3 times daily 10 mg, tramadol 10 mg  Hypertension-verapamil and propranolol  Tramadol - motrin, gastric ulcer.     The 10-year ASCVD risk score (Arnett DK, et al., 2019) is: 2.6%  Health Maintenance Due  Topic Date Due   HIV Screening  Never done   MAMMOGRAM  07/19/2021   INFLUENZA VACCINE  01/07/2023   COVID-19 Vaccine (1 - 2023-24 season) Never done      Objective:     LMP 10/23/2011  {Vitals History (Optional):23777}  Physical Exam   No results found for any visits on 03/29/23.      Assessment & Plan:   There are no diagnoses linked to this encounter.   No follow-ups on file.    Sandre Kitty, MD

## 2023-03-29 NOTE — Patient Instructions (Addendum)
It was nice to see you today,  We addressed the following topics today:  -I have sent in the [facility] prescription.  Continue to take the Rybelsus until you get the [facility].  Once you take [facility] you can stop the Rybelsus unless I talk with you about titrating off the Rybelsus.  Have a great day,  Frederic Jericho, MD

## 2023-03-30 ENCOUNTER — Other Ambulatory Visit: Payer: Self-pay | Admitting: Family Medicine

## 2023-03-30 DIAGNOSIS — Z79899 Other long term (current) drug therapy: Secondary | ICD-10-CM | POA: Insufficient documentation

## 2023-03-30 NOTE — Assessment & Plan Note (Addendum)
We have discussed in depth the patient's medication fill history and current medication list.  She endorses compliance with these medications.  Confirm she is taking them.  Discussed the risks of multiple psychiatric medications and the risk of multiple antipsychotics including serotonin syndrome, QT prolongation.  Because the patient has been stable on these medications for several years I did not try to talk her into decreasing any of them.  Simply made sure she was aware of the risks and signs/symptoms to be aware of.  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 - Would recommend yearly EKGs, lipid panels, and CMP's

## 2023-03-30 NOTE — Assessment & Plan Note (Signed)
Patient would like to switch from Rybelsus to injectable.  Resent again [facility] prescription.  Discussed with patient how there may be some issues with switching from one to the other in regards to insurance coverage and we will try to work on this but she should take the Rybelsus until she gets the new prescription for [facility].

## 2023-04-04 ENCOUNTER — Other Ambulatory Visit: Payer: Self-pay | Admitting: Family Medicine

## 2023-04-04 DIAGNOSIS — G43909 Migraine, unspecified, not intractable, without status migrainosus: Secondary | ICD-10-CM

## 2023-04-04 DIAGNOSIS — I10 Essential (primary) hypertension: Secondary | ICD-10-CM

## 2023-04-07 ENCOUNTER — Encounter: Payer: Self-pay | Admitting: Family Medicine

## 2023-04-26 ENCOUNTER — Other Ambulatory Visit: Payer: Self-pay | Admitting: Family Medicine

## 2023-05-09 ENCOUNTER — Other Ambulatory Visit: Payer: Self-pay | Admitting: Family Medicine

## 2023-05-09 DIAGNOSIS — M064 Inflammatory polyarthropathy: Secondary | ICD-10-CM

## 2023-05-10 ENCOUNTER — Encounter: Payer: Self-pay | Admitting: Family Medicine

## 2023-05-10 ENCOUNTER — Other Ambulatory Visit: Payer: Self-pay | Admitting: Family Medicine

## 2023-05-10 ENCOUNTER — Ambulatory Visit (INDEPENDENT_AMBULATORY_CARE_PROVIDER_SITE_OTHER): Payer: 59 | Admitting: Family Medicine

## 2023-05-10 VITALS — BP 96/65 | HR 63 | Ht 60.0 in | Wt 157.0 lb

## 2023-05-10 DIAGNOSIS — Z79899 Other long term (current) drug therapy: Secondary | ICD-10-CM | POA: Diagnosis not present

## 2023-05-10 DIAGNOSIS — M064 Inflammatory polyarthropathy: Secondary | ICD-10-CM

## 2023-05-10 DIAGNOSIS — J302 Other seasonal allergic rhinitis: Secondary | ICD-10-CM

## 2023-05-10 DIAGNOSIS — J3089 Other allergic rhinitis: Secondary | ICD-10-CM

## 2023-05-10 DIAGNOSIS — N9419 Other specified dyspareunia: Secondary | ICD-10-CM

## 2023-05-10 DIAGNOSIS — I10 Essential (primary) hypertension: Secondary | ICD-10-CM | POA: Diagnosis not present

## 2023-05-10 DIAGNOSIS — G43909 Migraine, unspecified, not intractable, without status migrainosus: Secondary | ICD-10-CM

## 2023-05-10 DIAGNOSIS — R7303 Prediabetes: Secondary | ICD-10-CM | POA: Diagnosis not present

## 2023-05-10 MED ORDER — VERAPAMIL HCL 40 MG PO TABS: 40.0000 mg | ORAL_TABLET | Freq: Every day | ORAL | 1 refills | Status: DC

## 2023-05-10 MED ORDER — RYBELSUS 14 MG PO TABS: 14.0000 mg | ORAL_TABLET | Freq: Every day | ORAL | 1 refills | Status: DC

## 2023-05-10 MED ORDER — PROPRANOLOL HCL ER 120 MG PO CP24
120.0000 mg | ORAL_CAPSULE | Freq: Every day | ORAL | 1 refills | Status: DC
Start: 2023-05-10 — End: 2023-08-19

## 2023-05-10 MED ORDER — MOMETASONE FUROATE 50 MCG/ACT NA SUSP
2.0000 | Freq: Every day | NASAL | 3 refills | Status: DC
Start: 2023-05-10 — End: 2023-05-11

## 2023-05-10 MED ORDER — MONTELUKAST SODIUM 10 MG PO TABS: ORAL_TABLET | ORAL | 1 refills | Status: DC

## 2023-05-10 MED ORDER — WEGOVY 0.5 MG/0.5ML ~~LOC~~ SOAJ: 0.5000 mg | SUBCUTANEOUS | 2 refills | Status: DC

## 2023-05-10 NOTE — Patient Instructions (Signed)
It was nice to see you today,  We addressed the following topics today: -We will resubmit the [facility] through [place].  If we run into any issues we will let you now. -I have sent in Rybelsus and your other refill request.  Have a great day,  Frederic Jericho, MD

## 2023-05-10 NOTE — Assessment & Plan Note (Signed)
EKG performed today.  QTc was normal.  Sinus bradycardia consistent with EKGs from several years ago.

## 2023-05-10 NOTE — Assessment & Plan Note (Signed)
Has not been able to get [facility].  Resending through [employer] instead of [employer] but she will.  She needs a refill on Rybelsus until she can get approved for the [facility].  Sending in prescriptions for both.

## 2023-05-10 NOTE — Progress Notes (Unsigned)
   Established Patient Office Visit  Subjective   Patient ID: [name] [name], female    DOB: [birth year]  Age: 62 y.o. MRN: [id number]  Chief Complaint  Patient presents with   Medical Management of Chronic Issues    HPI  Patient has not been able to get her [facility] prescription filled.  Was not covered by [facility].  No longer has [employer].  Only has Community education officer.  Would like [place] to resend it through [employer].  She also needs Rybelsus until she can get her [facility].  Patient needs refills on multiple medications.    Recently saw her psychiatrist.  No changes to her medications.   The 10-year ASCVD risk score (Arnett DK, et al., 2019) is: 2.3%  Health Maintenance Due  Topic Date Due   COVID-19 Vaccine (1) Never done   HIV Screening  Never done   MAMMOGRAM  07/19/2021      Objective:     BP 96/65   Pulse 63   Ht 5' (1.524 m)   Wt 157 lb (71.2 kg)   LMP 10/23/2011   SpO2 97%   BMI 30.66 kg/m  {Vitals History (Optional):23777}  Physical Exam General: Alert, oriented Pulmonary: No respiratory distress. Psych: Pleasant affect, spontaneous speech.   No results found for any visits on 05/10/23.      Assessment & Plan:   Long term current use of antipsychotic medication -     EKG 12-Lead  Dyspareunia due to medical condition in female  Seasonal and perennial allergic rhinitis -     Montelukast Sodium; TAKE 1 TABLET BY MOUTH EVERYDAY AT BEDTIME  Dispense: 90 tablet; Refill: 1 -     Mometasone Furoate; Place 2 sprays into the nose daily.  Dispense: 41 g; Refill: 3  Essential hypertension -     Propranolol HCl ER; Take 1 capsule (120 mg total) by mouth at bedtime.  Dispense: 90 capsule; Refill: 1 -     Verapamil HCl; Take 1 tablet (40 mg total) by mouth daily.  Dispense: 90 tablet; Refill: 1  Migraine without status migrainosus, not intractable, unspecified migraine type -     Propranolol HCl ER; Take 1 capsule (120 mg total) by mouth at  bedtime.  Dispense: 90 capsule; Refill: 1  Inflammatory polyarthritis (HCC)  Prediabetes Assessment & Plan: Has not been able to get [facility].  Resending through [employer] instead of [employer] but she will.  She needs a refill on Rybelsus until she can get approved for the [facility].  Sending in prescriptions for both.   Polypharmacy Assessment & Plan: EKG performed today.  QTc was normal.  Sinus bradycardia consistent with EKGs from several years ago.   Other orders -     Rybelsus; Take 1 tablet (14 mg total) by mouth daily.  Dispense: 90 tablet; Refill: 1 -     Wegovy; Inject 0.5 mg into the skin once a week.  Dispense: 2 mL; Refill: 2     Return in about 3 months (around 08/08/2023) for DM.    Sandre Kitty, MD

## 2023-05-11 NOTE — Telephone Encounter (Signed)
Can you let the patient know that it seems her [employer] covers Flonase but not Nasonex.  Since it is over-the-counter, if she wants to stick with that brand, she would have to pay for it OTC.   if it does not matter whether it is Flonase or Nasonex, I can send in Flonase instead.

## 2023-05-12 MED ORDER — TRAMADOL HCL 50 MG PO TABS: 50.0000 mg | ORAL_TABLET | Freq: Three times a day (TID) | ORAL | 1 refills | Status: DC | PRN

## 2023-05-12 MED ORDER — DIAZEPAM 2 MG PO TABS
ORAL_TABLET | ORAL | 2 refills | Status: DC
Start: 2023-05-12 — End: 2023-10-13

## 2023-05-12 NOTE — Addendum Note (Signed)
Addended by: Sandre Kitty on: 05/12/2023 06:58 AM   Modules accepted: Orders

## 2023-06-01 ENCOUNTER — Other Ambulatory Visit: Payer: Self-pay | Admitting: Nurse Practitioner

## 2023-06-01 DIAGNOSIS — Z6830 Body mass index (BMI) 30.0-30.9, adult: Secondary | ICD-10-CM

## 2023-06-01 NOTE — Telephone Encounter (Signed)
Hey dan. You are listed as her PCP. Can you fill this? Thanks.  -HB

## 2023-07-05 ENCOUNTER — Other Ambulatory Visit: Payer: Self-pay | Admitting: Family Medicine

## 2023-07-05 DIAGNOSIS — M064 Inflammatory polyarthropathy: Secondary | ICD-10-CM

## 2023-07-05 MED ORDER — TRAMADOL HCL 50 MG PO TABS: 50.0000 mg | ORAL_TABLET | Freq: Three times a day (TID) | ORAL | 1 refills | Status: DC | PRN

## 2023-07-05 NOTE — Telephone Encounter (Signed)
Copied from CRM [id number]. Topic: Clinical - Medication Refill >> Jul 05, 2023  3:06 PM Thomes Dinning wrote: Most Recent Primary Care Visit:  Provider: Sandre Kitty  Department: [facility]  Visit Type: OFFICE VISIT 20  Date: 05/10/2023  Medication: traMADol (ULTRAM) 50 MG tablet  Has the patient contacted their pharmacy? Yes (Agent: If no, request that the patient contact the pharmacy for the refill. If patient does not wish to contact the pharmacy document the reason why and proceed with request.) (Agent: If yes, when and what did the pharmacy advise?)  Is this the correct pharmacy for this prescription? Yes If no, delete pharmacy and type the correct one.  This is the patient's preferred pharmacy:  [id number] - [address] [place] [address] Phone: [phone] Fax: [phone]   Has the prescription been filled recently? Yes  Is the patient out of the medication? Yes  Has the patient been seen for an appointment in the last year OR does the patient have an upcoming appointment? Yes  Can we respond through MyChart? Yes  Agent: Please be advised that Rx refills may take up to 3 business days. We ask that you follow-up with your pharmacy.

## 2023-07-09 ENCOUNTER — Telehealth: Payer: Self-pay | Admitting: Family Medicine

## 2023-07-09 NOTE — Telephone Encounter (Signed)
Copied from CRM [id number]. Topic: Clinical - Prescription Issue >> Jul 09, 2023  3:49 PM Geneva B wrote: Reason for [id number] is out of and says she can not wait until 07/12/2023 traMADol (ULTRAM) 50 MG tablet please call patient [phone]

## 2023-07-12 NOTE — Telephone Encounter (Signed)
Tried to contact pt to let her know that the Rx was sent in on 07/05/23 however could not be filled until today per the Rx instructions.  Her message came in on Friday at 3:49 however our office closes at 12:00pm on Fridays.

## 2023-08-09 ENCOUNTER — Ambulatory Visit: Payer: 59 | Admitting: Family Medicine

## 2023-08-17 ENCOUNTER — Ambulatory Visit: Payer: 59 | Admitting: Family Medicine

## 2023-08-19 ENCOUNTER — Other Ambulatory Visit: Payer: Self-pay | Admitting: Family Medicine

## 2023-08-19 DIAGNOSIS — I10 Essential (primary) hypertension: Secondary | ICD-10-CM

## 2023-08-19 DIAGNOSIS — G43909 Migraine, unspecified, not intractable, without status migrainosus: Secondary | ICD-10-CM

## 2023-09-12 ENCOUNTER — Other Ambulatory Visit: Payer: Self-pay | Admitting: Family Medicine

## 2023-09-12 DIAGNOSIS — M064 Inflammatory polyarthropathy: Secondary | ICD-10-CM

## 2023-09-14 ENCOUNTER — Ambulatory Visit (INDEPENDENT_AMBULATORY_CARE_PROVIDER_SITE_OTHER): Admitting: Family Medicine

## 2023-09-14 ENCOUNTER — Other Ambulatory Visit: Payer: Self-pay | Admitting: *Deleted

## 2023-09-14 ENCOUNTER — Encounter: Payer: Self-pay | Admitting: Family Medicine

## 2023-09-14 VITALS — BP 96/74 | HR 63 | Ht 60.0 in | Wt 155.0 lb

## 2023-09-14 DIAGNOSIS — E782 Mixed hyperlipidemia: Secondary | ICD-10-CM

## 2023-09-14 DIAGNOSIS — I10 Essential (primary) hypertension: Secondary | ICD-10-CM

## 2023-09-14 DIAGNOSIS — J302 Other seasonal allergic rhinitis: Secondary | ICD-10-CM

## 2023-09-14 DIAGNOSIS — J3089 Other allergic rhinitis: Secondary | ICD-10-CM

## 2023-09-14 DIAGNOSIS — R7303 Prediabetes: Secondary | ICD-10-CM

## 2023-09-14 LAB — POCT GLYCOSYLATED HEMOGLOBIN (HGB A1C): HbA1c POC (<> result, manual entry): 5.8 % (ref 4.0–5.6)

## 2023-09-14 MED ORDER — SEMAGLUTIDE-WEIGHT MANAGEMENT 1 MG/0.5ML ~~LOC~~ SOAJ: 1.0000 mg | SUBCUTANEOUS | 1 refills | Status: DC

## 2023-09-14 MED ORDER — MOMETASONE FUROATE 50 MCG/ACT NA SUSP: 2.0000 | Freq: Every day | NASAL | 12 refills | Status: AC

## 2023-09-14 NOTE — Progress Notes (Signed)
   Established Patient Office Visit  Subjective   Patient ID: [name] [name], female    DOB: [birth year]  Age: 63 y.o. MRN: [id number]  Chief Complaint  Patient presents with   Medical Management of Chronic Issues    HPI  Weight loss-patient is okay with paying out-of-pocket monthly for [facility].  We discussed increasing it to 1 mg.  Advised her if she is wanting to increase it further before our next visit let us  know otherwise we will keep at 1 mg until then.  Patient takes Pedialax if she has constipation related to the [facility]  Allergies patient wants to try Nasonex.  We discussed how the reason we switched from Nasonex to fluticasone in the first place was due to insurance, but that it is over-the-counter and she can pick this up over-the-counter if insurance still does not pay for.   The 10-year ASCVD risk score (Arnett DK, et al., 2019) is: 2.3%  Health Maintenance Due  Topic Date Due   COVID-19 Vaccine (1) Never done   HIV Screening  Never done   MAMMOGRAM  07/19/2021      Objective:     BP 96/74   Pulse 63   Ht 5' (1.524 m)   Wt 155 lb (70.3 kg)   LMP 10/23/2011   SpO2 96%   BMI 30.27 kg/m    Physical Exam General: Alert, oriented Pulmonary: No respiratory distress Psych: Pleasant affect   Results for orders placed or performed in visit on 09/14/23  POCT glycosylated hemoglobin (Hb A1C)  Result Value Ref Range   Hemoglobin A1C     HbA1c POC (<> result, manual entry) 5.8 4.0 - 5.6 %   HbA1c, POC (prediabetic range)     HbA1c, POC (controlled diabetic range)          Assessment & Plan:   Prediabetes Assessment & Plan: Patient paying for [facility] out-of-pocket.  Increasing to 1 mg.  Discussed management of side effects such as constipation or nausea.  Orders: -     POCT glycosylated hemoglobin (Hb A1C) -     Hemoglobin A1c -     TSH  Essential hypertension -     CBC with Differential/Platelet -     Lipid panel  Mixed hyperlipidemia -      CBC with Differential/Platelet -     Comprehensive metabolic panel with GFR -     Lipid panel  Seasonal and perennial allergic rhinitis Assessment & Plan: Patient prefer Nasonex to fluticasone.  Does not like the smell.  Sending in Nasonex again but if insurance does not pay for then I advised her she can pick it up over-the-counter    Other orders -     Semaglutide-Weight Management; Inject 1 mg into the skin once a week.  Dispense: 2 mL; Refill: 1 -     Mometasone Furoate; Place 2 sprays into the nose daily.  Dispense: 1 each; Refill: 12     Return in about 3 months (around 12/14/2023) for physical.    Laneta Pintos, MD

## 2023-09-14 NOTE — Patient Instructions (Addendum)
 It was nice to see you today,  We addressed the following topics today: -I am sending in a higher dose of your Wegovy of 1 mg.  After a month if you want to increase this to 1.7 mg let me know.  Otherwise we will keep at 1 mg until I see you again. - When I see you again in 3 months we can do your physical and get labs a week before. - I will send in Nasonex instead of fluticasone, but the reason we switched in the first place was your insurance no longer paid for Nasonex so they may not still pay for it.   Have a great day,  Frederic Jericho, MD

## 2023-09-18 NOTE — Assessment & Plan Note (Signed)
 Patient prefer Nasonex to fluticasone.  Does not like the smell.  Sending in Nasonex again but if insurance does not pay for then I advised her she can pick it up over-the-counter

## 2023-09-18 NOTE — Assessment & Plan Note (Signed)
 Patient paying for [facility] out-of-pocket.  Increasing to 1 mg.  Discussed management of side effects such as constipation or nausea.

## 2023-09-22 ENCOUNTER — Encounter: Payer: Self-pay | Admitting: Family Medicine

## 2023-10-13 ENCOUNTER — Other Ambulatory Visit: Payer: Self-pay | Admitting: Family Medicine

## 2023-10-13 DIAGNOSIS — N9419 Other specified dyspareunia: Secondary | ICD-10-CM

## 2023-10-28 ENCOUNTER — Other Ambulatory Visit: Payer: Self-pay | Admitting: Family Medicine

## 2023-10-28 DIAGNOSIS — I10 Essential (primary) hypertension: Secondary | ICD-10-CM

## 2023-11-18 ENCOUNTER — Encounter: Payer: Self-pay | Admitting: Family Medicine

## 2023-11-18 ENCOUNTER — Telehealth: Payer: Self-pay

## 2023-11-18 MED ORDER — WEGOVY 1.7 MG/0.75ML ~~LOC~~ SOAJ: 1.7000 mg | SUBCUTANEOUS | 3 refills | Status: DC

## 2023-11-18 NOTE — Telephone Encounter (Signed)
 Pt requesting increased dose of Wegovy . Currently on 1 mg. Has follow up appointment scheduled. 1.7 mg dose of Wegovy  sent.  Odilia [place], PA-C

## 2023-11-19 ENCOUNTER — Telehealth: Payer: Self-pay | Admitting: Family Medicine

## 2023-11-19 ENCOUNTER — Other Ambulatory Visit: Payer: Self-pay | Admitting: Family Medicine

## 2023-11-19 DIAGNOSIS — M064 Inflammatory polyarthropathy: Secondary | ICD-10-CM

## 2023-11-19 MED ORDER — TRAMADOL HCL 50 MG PO TABS: 50.0000 mg | ORAL_TABLET | Freq: Three times a day (TID) | ORAL | 1 refills | Status: DC | PRN

## 2023-11-19 NOTE — Telephone Encounter (Signed)
Its been sent in 

## 2023-11-19 NOTE — Telephone Encounter (Signed)
 Copied from CRM [id number]. Topic: Clinical - Medication Refill >> Nov 19, 2023 11:51 AM Donald Frost wrote: Medication: traMADol  (ULTRAM ) 50 MG tablet  Has the patient contacted their pharmacy? No   This is the patient's preferred pharmacy:  [id number] - [address] [place] [address] Phone: [phone] Fax: [phone]  Is this the correct pharmacy for this prescription? Yes If no, delete pharmacy and type the correct one.   Has the prescription been filled recently? No  Is the patient out of the medication? Yes  Has the patient been seen for an appointment in the last year OR does the patient have an upcoming appointment? Yes  Can we respond through MyChart? Yes  Please assist patient further and I spoke with April and she said to send this in to clinical so it can be taken care of

## 2023-11-19 NOTE — Telephone Encounter (Signed)
Pt informed

## 2023-11-28 ENCOUNTER — Other Ambulatory Visit: Payer: Self-pay | Admitting: Family Medicine

## 2023-11-28 DIAGNOSIS — G8929 Other chronic pain: Secondary | ICD-10-CM

## 2023-11-28 DIAGNOSIS — J302 Other seasonal allergic rhinitis: Secondary | ICD-10-CM

## 2023-12-08 ENCOUNTER — Ambulatory Visit: Payer: Self-pay | Admitting: Family Medicine

## 2023-12-08 LAB — CBC WITH DIFFERENTIAL/PLATELET
Basophils Absolute: 0.1 10*3/uL (ref 0.0–0.2)
Basos: 1 %
EOS (ABSOLUTE): 0.1 10*3/uL (ref 0.0–0.4)
Eos: 1 %
Hematocrit: 39.2 % (ref 34.0–46.6)
Hemoglobin: 12.6 g/dL (ref 11.1–15.9)
Immature Grans (Abs): 0 10*3/uL (ref 0.0–0.1)
Immature Granulocytes: 0 %
Lymphocytes Absolute: 2.4 10*3/uL (ref 0.7–3.1)
Lymphs: 37 %
MCH: 27.9 pg (ref 26.6–33.0)
MCHC: 32.1 g/dL (ref 31.5–35.7)
MCV: 87 fL (ref 79–97)
Monocytes Absolute: 0.5 10*3/uL (ref 0.1–0.9)
Monocytes: 7 %
Neutrophils Absolute: 3.5 10*3/uL (ref 1.4–7.0)
Neutrophils: 54 %
Platelets: 315 10*3/uL (ref 150–450)
RBC: 4.51 x10E6/uL (ref 3.77–5.28)
RDW: 14.6 % (ref 11.7–15.4)
WBC: 6.5 10*3/uL (ref 3.4–10.8)

## 2023-12-08 LAB — COMPREHENSIVE METABOLIC PANEL WITH GFR
ALT: 29 IU/L (ref 0–32)
AST: 23 IU/L (ref 0–40)
Albumin: 4.4 g/dL (ref 3.8–4.9)
Alkaline Phosphatase: 144 IU/L — ABNORMAL HIGH (ref 44–121)
BUN/Creatinine Ratio: 13 (ref 12–28)
BUN: 11 mg/dL (ref 8–27)
Bilirubin Total: <0.2 mg/dL (ref 0.0–1.2)
CO2: 26 mmol/L (ref 20–29)
Calcium: 9.8 mg/dL (ref 8.7–10.3)
Chloride: 100 mmol/L (ref 96–106)
Creatinine, Ser: 0.88 mg/dL (ref 0.57–1.00)
Globulin, Total: 2.8 g/dL (ref 1.5–4.5)
Glucose: 84 mg/dL (ref 70–99)
Potassium: 4.5 mmol/L (ref 3.5–5.2)
Sodium: 139 mmol/L (ref 134–144)
Total Protein: 7.2 g/dL (ref 6.0–8.5)
eGFR: 75 mL/min/{1.73_m2} (ref >59)

## 2023-12-08 LAB — LIPID PANEL
Chol/HDL Ratio: 3.2 ratio (ref 0.0–4.4)
Cholesterol, Total: 229 mg/dL — ABNORMAL HIGH (ref 100–199)
HDL: 71 mg/dL (ref >39)
LDL Chol Calc (NIH): 130 mg/dL — ABNORMAL HIGH (ref 0–99)
Triglycerides: 159 mg/dL — ABNORMAL HIGH (ref 0–149)
VLDL Cholesterol Cal: 28 mg/dL (ref 5–40)

## 2023-12-08 LAB — TSH: TSH: 1.46 u[IU]/mL (ref 0.450–4.500)

## 2023-12-08 LAB — HEMOGLOBIN A1C
Est. average glucose Bld gHb Est-mCnc: 114 mg/dL
Hgb A1c MFr Bld: 5.6 % (ref 4.8–5.6)

## 2023-12-22 ENCOUNTER — Ambulatory Visit (INDEPENDENT_AMBULATORY_CARE_PROVIDER_SITE_OTHER): Admitting: Family Medicine

## 2023-12-22 ENCOUNTER — Encounter: Payer: Self-pay | Admitting: Family Medicine

## 2023-12-22 VITALS — BP 115/70 | HR 70 | Ht 60.0 in | Wt 145.0 lb

## 2023-12-22 DIAGNOSIS — Z1231 Encounter for screening mammogram for malignant neoplasm of breast: Secondary | ICD-10-CM

## 2023-12-22 DIAGNOSIS — I10 Essential (primary) hypertension: Secondary | ICD-10-CM | POA: Diagnosis not present

## 2023-12-22 DIAGNOSIS — N9419 Other specified dyspareunia: Secondary | ICD-10-CM

## 2023-12-22 DIAGNOSIS — F39 Unspecified mood [affective] disorder: Secondary | ICD-10-CM

## 2023-12-22 DIAGNOSIS — F411 Generalized anxiety disorder: Secondary | ICD-10-CM

## 2023-12-22 DIAGNOSIS — Z0000 Encounter for general adult medical examination without abnormal findings: Secondary | ICD-10-CM

## 2023-12-22 NOTE — Progress Notes (Unsigned)
 Annual physical  Subjective    Patient ID: Danyal Adorno, female    DOB: 1961-04-26  Age: 63 y.o. MRN: [id number]  Chief Complaint  Patient presents with   Annual Exam   HPI Maylin is a 63 y.o. old female here  for annual exam.   Subjective - Reports weight loss, for which they are pleased. - Noted one episode of low blood pressure (85/62) approximately two weeks ago, causing them to stay in bed for the day. Self-adjusted propranolol  dose from 120 mg to 80 mg. No further episodes of hypotension. - Reports satisfaction with switching back to Nasonex  from Flonase for allergies. - Denies any other medication changes. Continues to see Dr. Baldemar for other medications, with next follow-up in September. - States recent labs were all fine, including low cholesterol despite a diet including eggs, bacon, and whole milk. - Regular exercise includes riding a horse. Reports that a 10-pound weight loss has significantly improved back pain. 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 - Reports picking up the last refill of tramadol  yesterday.  Medications Currently taking Wegovy  1.7 mg, paying out-of-pocket. Self-adjusted propranolol  to 80 mg. Also takes verapamil , Nasonex , and tramadol .  PMH, PSH, FH, Social Hx PMH: Hypertension, back pain, allergies. FH: Paternal history of bladder cancer, hypertension, diabetes, and AAA. Maternal history of lymphoma at age 68 and low blood sugar. States everybody smoked and had heart attacks and high blood pressure. 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  ROS CV: Denies further episodes of hypotension or dizziness since the single episode two weeks ago. MSK: Reports significant improvement in back pain with weight loss.    The 10-year ASCVD risk score (Arnett DK, et al., 2019) is: 3.4%  Health Maintenance Due  Topic Date Due   COVID-19 Vaccine (1) Never  done   HIV Screening  Never done   MAMMOGRAM  07/19/2021      Objective:     BP 115/70   Pulse 70   Ht 5' (1.524 m)   Wt 145 lb (65.8 kg)   LMP 10/23/2011   SpO2 100%   BMI 28.32 kg/m  {Vitals History (Optional):23777}  Physical Exam Gen: alert, oriented Heent: perrla, eomi Cv: rrr Pulm: lctab Gi; soft. Nbs Msk: strength equal b/l.   Skin: warm and dry Psych: pleasant affect   No results found for any visits on 12/22/23.      Assessment & Plan:   Physical exam, annual  Essential hypertension Assessment & Plan: History of hypertension, previously managed on propranolol  120 mg and verapamil  40mg . Recent weight loss has led to improved blood pressure control, with one episode of hypotension (85/62) about two weeks ago. Self-discontinued the 120 mg dose and started taking an old prescription of propranolol  80 mg. Blood pressure has been stable since. - Discontinue verapamil . - Continue propranolol  80 mg daily. - May increase propranolol  to 120 mg if needed for blood pressure control. - Advised to monitor blood pressure daily, with a goal of <135/85 mmHg. Will re-evaluate if readings are consistently in the 140s/90s.   Dyspareunia due to medical condition in female  Encounter for screening mammogram for malignant neoplasm of breast -     3D Screening Mammogram, Left and Right; Future  Generalized anxiety disorder Assessment & Plan: Continue mgmt per psychiatrist.  Stable.    Mood disorder [facility]) Assessment & Plan: Continue mgmt per psychiatrist.  No changes to medications since last visit.       Return in about 1 year (around 12/21/2024) for physical.    Toribio MARLA Slain, MD

## 2023-12-22 NOTE — Assessment & Plan Note (Signed)
 History of hypertension, previously managed on propranolol  120 mg and verapamil  40mg . Recent weight loss has led to improved blood pressure control, with one episode of hypotension (85/62) about two weeks ago. Self-discontinued the 120 mg dose and started taking an old prescription of propranolol  80 mg. Blood pressure has been stable since. - Discontinue verapamil . - Continue propranolol  80 mg daily. - May increase propranolol  to 120 mg if needed for blood pressure control. - Advised to monitor blood pressure daily, with a goal of <135/85 mmHg. Will re-evaluate if readings are consistently in the 140s/90s.

## 2023-12-22 NOTE — Patient Instructions (Addendum)
 It was nice to see you today,  We addressed the following topics today: -Overall your labs look good.  No changes to your medications today - I think now that you have lost weight and your blood pressure is better we can try stopping the verapamil .  What you can do is continue taking the 80 mg propranolol , check your blood pressure daily, and if it increases to a point where it is in the 140s over 90s regularly or even averaging close to that you can increase your propranolol  back to the 120 mg.  Then continue to check your blood pressure and if we need to we can add back verapamil  or something else.   Have a great day,  Rolan Slain, MD

## 2023-12-22 NOTE — Assessment & Plan Note (Signed)
 Continue mgmt per psychiatrist.  No changes to medications since last visit.

## 2023-12-22 NOTE — Assessment & Plan Note (Signed)
 Continue mgmt per psychiatrist.  Stable.

## 2024-01-26 ENCOUNTER — Other Ambulatory Visit: Payer: Self-pay | Admitting: Family Medicine

## 2024-01-26 DIAGNOSIS — G43909 Migraine, unspecified, not intractable, without status migrainosus: Secondary | ICD-10-CM

## 2024-01-26 DIAGNOSIS — I10 Essential (primary) hypertension: Secondary | ICD-10-CM

## 2024-01-29 ENCOUNTER — Other Ambulatory Visit: Payer: Self-pay | Admitting: Family Medicine

## 2024-01-29 ENCOUNTER — Encounter: Payer: Self-pay | Admitting: Family Medicine

## 2024-01-29 DIAGNOSIS — M064 Inflammatory polyarthropathy: Secondary | ICD-10-CM

## 2024-03-07 ENCOUNTER — Other Ambulatory Visit: Payer: Self-pay

## 2024-03-27 ENCOUNTER — Encounter: Payer: Self-pay | Admitting: Family Medicine

## 2024-03-27 ENCOUNTER — Other Ambulatory Visit: Payer: Self-pay | Admitting: Family Medicine

## 2024-03-27 DIAGNOSIS — M064 Inflammatory polyarthropathy: Secondary | ICD-10-CM

## 2024-03-27 NOTE — Telephone Encounter (Signed)
 Copied from CRM #[id number]. Topic: Clinical - Medication Refill >> Mar 27, 2024  9:14 AM Everette C wrote: Medication: traMADol  (ULTRAM ) 50 MG tablet  Has the patient contacted their pharmacy? Yes (Agent: If no, request that the patient contact the pharmacy for the refill. If patient does not wish to contact the pharmacy document the reason why and proceed with request.) (Agent: If yes, when and what did the pharmacy advise?)  This is the patient's preferred pharmacy:  [id number] [place] - [address] [place] [address] Phone: [phone] Fax: [phone]  Is this the correct pharmacy for this prescription? Yes If no, delete pharmacy and type the correct one.   Has the prescription been filled recently? Yes  Is the patient out of the medication? Yes  Has the patient been seen for an appointment in the last year OR does the patient have an upcoming appointment? Yes  Can we respond through MyChart? No  Agent: Please be advised that Rx refills may take up to 3 business days. We ask that you follow-up with your pharmacy.

## 2024-03-30 ENCOUNTER — Ambulatory Visit (INDEPENDENT_AMBULATORY_CARE_PROVIDER_SITE_OTHER): Admitting: Family Medicine

## 2024-03-30 ENCOUNTER — Encounter: Payer: Self-pay | Admitting: Family Medicine

## 2024-03-30 VITALS — BP 133/78 | HR 86 | Ht 60.0 in | Wt 138.4 lb

## 2024-03-30 DIAGNOSIS — I10 Essential (primary) hypertension: Secondary | ICD-10-CM

## 2024-03-30 MED ORDER — PROPRANOLOL HCL ER 80 MG PO CP24: 80.0000 mg | ORAL_CAPSULE | Freq: Every day | ORAL | 1 refills | Status: AC

## 2024-03-30 NOTE — Patient Instructions (Signed)
 It was nice to see you today,  We addressed the following topics today: - Please decrease your propranolol  extended-release to 80 mg every night. I have sent a 90-day prescription for this new dose to your pharmacy. - Be aware that the short-acting propranolol  you have is more potent at the same milligram dose than the long-acting version. Do not use the short-acting 80mg  tablets you have as a substitute, as this may cause significant low blood pressure. - Continue to monitor your blood pressure. - As you continue to lose weight and exercise, your blood pressure should improve, and further medication adjustments may be necessary. - If you continue to have issues with low blood pressure, send me a message through MyChart. We can then try an even lower dose (60mg ) or discuss other options. - You do not need to schedule a follow-up appointment if your blood pressure normalizes and you feel well. Just send me a message with your readings to keep me updated.  Have a great day,  Rolan Slain, MD

## 2024-03-30 NOTE — Assessment & Plan Note (Signed)
 Having episodes of Hypotension, likely secondary to antihypertensive medication. The hypotension episode occurred after increasing propranolol  ER to 120mg  nightly. The patient also takes prazosin  and a benzodiazepine, which can contribute. Given recent weight loss and exercise, a lower dose of antihypertensives may be appropriate. - Decrease propranolol  ER to 80 mg nightly. A 90-day supply has been sent to the pharmacy. - If hypotension persists on 80 mg, will trial 60 mg. - If symptoms continue, may consider switching to morning dosing or changing to a short-acting formulation. - Continue to monitor blood pressure at home. - Follow up via MyChart message with blood pressure readings or if symptoms persist. No in-person follow-up is needed if stable.

## 2024-03-30 NOTE — Progress Notes (Signed)
   Established Patient Office Visit  Subjective   Patient ID: [name] [name], female    DOB: March 18, 1961  Age: 63 y.o. MRN: [id number]  Chief Complaint  Patient presents with   Medical Management of Chronic Issues    HPI Subjective - Reports symptomatic hypotension with a reading of 64/50 last Wednesday upon waking. Symptoms included dizziness and ataxia, described as feeling like stumbling drunk and having difficulty standing. This occurs after a recent medication change where verapamil  was discontinued and propranolol  ER was increased from 80mg  to 120mg  nightly. - Reports continued symptoms of hypotension on the current regimen. - Reports weight loss and believes a dose reduction in antihypertensives is now necessary.  Medications Current medications include propranolol  ER 120 mg nightly for blood pressure and anxiety, and prazosin  1 mg, two tablets at night and one tablet midday. Also takes a benzodiazepine up to three times a day, but typically twice a day.  PMH, PSH, FH, Social Hx PMHx: Hypertension, anxiety. Social Hx: Reports exercising and has lost weight.  ROS Pertinent positives: Dizziness, ataxia. Pertinent negatives: No report of fatigue with daytime medication use, as it is avoided.    The 10-year ASCVD risk score (Arnett DK, et al., 2019) is: 5%  Health Maintenance Due  Topic Date Due   COVID-19 Vaccine (1) Never done   HIV Screening  Never done   Mammogram  07/19/2021   Zoster Vaccines- Shingrix (2 of 2) 10/26/2023   Influenza Vaccine  01/07/2024      Objective:     BP 133/78   Pulse 86   Ht 5' (1.524 m)   Wt 138 lb 6.4 oz (62.8 kg)   LMP 10/23/2011   SpO2 98%   BMI 27.03 kg/m    Physical Exam Gen: alert, oriented Pulm: no respiratory distress Psych: pleasant affect   No results found for any visits on 03/30/24.      Assessment & Plan:   Essential hypertension Assessment & Plan: Having episodes of Hypotension, likely secondary to  antihypertensive medication. The hypotension episode occurred after increasing propranolol  ER to 120mg  nightly. The patient also takes prazosin  and a benzodiazepine, which can contribute. Given recent weight loss and exercise, a lower dose of antihypertensives may be appropriate. - Decrease propranolol  ER to 80 mg nightly. A 90-day supply has been sent to the pharmacy. - If hypotension persists on 80 mg, will trial 60 mg. - If symptoms continue, may consider switching to morning dosing or changing to a short-acting formulation. - Continue to monitor blood pressure at home. - Follow up via MyChart message with blood pressure readings or if symptoms persist. No in-person follow-up is needed if stable.   Other orders -     Propranolol  HCl ER; Take 1 capsule (80 mg total) by mouth daily.  Dispense: 90 capsule; Refill: 1     No follow-ups on file.    [name] MARLA Slain, MD

## 2024-04-10 ENCOUNTER — Other Ambulatory Visit: Payer: Self-pay | Admitting: Family Medicine

## 2024-04-10 DIAGNOSIS — N9419 Other specified dyspareunia: Secondary | ICD-10-CM

## 2024-04-21 ENCOUNTER — Other Ambulatory Visit: Payer: Self-pay | Admitting: Family Medicine

## 2024-04-21 DIAGNOSIS — K219 Gastro-esophageal reflux disease without esophagitis: Secondary | ICD-10-CM

## 2024-06-29 ENCOUNTER — Telehealth: Payer: Self-pay

## 2024-06-29 NOTE — Transitions of Care (Post Inpatient/ED Visit) (Signed)
 "  06/29/2024  Name: [name] [name] MRN: [id number] DOB: 07/21/1960  Today's TOC FU Call Status: Today's TOC FU Call Status:: Successful TOC FU Call Completed TOC FU Call Complete Date: 06/29/24  Patient's Name and Date of Birth confirmed. Name, DOB  Transition Care Management Follow-up Telephone Call Date of Discharge: 06/28/24 Discharge Facility: Other [facility]) Name of Other (Non[facility]) Discharge Facility: [facility] Type of Discharge: Inpatient Admission Primary Inpatient Discharge Diagnosis:: mirgraine How have you been since you were released from the hospital?: Better Any questions or concerns?: No  Items Reviewed: Did you receive and understand the discharge instructions provided?: Yes Medications obtained,verified, and reconciled?: Yes (Medications Reviewed) Any new allergies since your discharge?: No Dietary orders reviewed?: Yes Do you have support at home?: Yes People in Home [RPT]: spouse  Medications Reviewed Today: Medications Reviewed Today     Reviewed by Emmitt Pan, LPN (Licensed Practical Nurse) on 06/29/24 at [phone]  Med List Status: <None>   Medication Order Taking? Sig Documenting Provider Last Dose Status Informant  albuterol  (VENTOLIN  HFA) 108 (90 Base) MCG/ACT inhaler [id number] Yes Inhale 2 puffs into the lungs every 6 (six) hours as needed. Boscia, Lajoyce E, NP  Active   ALPRAZolam (XANAX) 0.5 MG tablet [id number] Yes TK 1 T PO TID [provider]  Active            Med Note (FISHER, SUSAN W   Wed Jun 19, 2015  4:18 PM) Received from: External Pharmacy  ARIPiprazole (ABILIFY) 10 MG tablet [id number] Yes  [provider]  Active   bimatoprost  (LATISSE ) 0.03 % ophthalmic solution [id number] Yes Place 1 drop into both eyes at bedtime. Place one drop on applicator and apply evenly along the skin of the upper eyelid at base of eyelashes once daily at bedtime; repeat procedure for second eye (use a clean applicator). Boscia, Gabriellah E,  NP  Active   busPIRone (BUSPAR) 10 MG tablet [id number] Yes TK 1 T PO BID [provider]  Active            Med Note (FISHER, SUSAN W   Wed Jun 19, 2015  4:18 PM) Received from: External Pharmacy  cetirizine  (ZYRTEC ) 10 MG tablet [id number] Yes Take 1 tablet (10 mg total) by mouth daily. Boscia, Alayziah E, NP  Active   clotrimazole  (MYCELEX ) 10 MG troche [id number] Yes Take 1 tablet (10 mg total) by mouth 5 (five) times daily. Boscia, Kim E, NP  Active   conjugated estrogens  (PREMARIN ) vaginal cream [id number]  Place 1 Applicatorful vaginally daily.  Patient not taking: Reported on 06/29/2024   Boscia, Fortunata E, NP  Active   cyclobenzaprine  (FLEXERIL ) 10 MG tablet [id number] Yes TAKE 1 TABLET(10 MG) BY MOUTH THREE TIMES DAILY AS NEEDED FOR MUSCLE SPASMS Chandra Toribio POUR, MD  Active   diazepam  (VALIUM ) 2 MG tablet [id number] Yes INSERT 1 TABLET VAGINALLY ONCE A DAY AS NEEDED Clapp, Kara F, PA-C  Active   gabapentin  (NEURONTIN ) 600 MG tablet [id number] Yes  [provider]  Active   mometasone  (NASONEX ) 50 MCG/ACT nasal spray [id number] Yes Place 2 sprays into the nose daily. Chandra Toribio POUR, MD  Active   montelukast  (SINGULAIR ) 10 MG tablet [id number] Yes TAKE 1 TABLET BY MOUTH EVERYDAY AT BEDTIME Chandra Toribio POUR, MD  Active   pantoprazole  (PROTONIX ) 20 MG tablet [id number] Yes TAKE 1 TABLET BY MOUTH EVERY DAY Chandra Toribio POUR, MD  Active   prazosin  (MINIPRESS ) 1 MG capsule [id number] Yes Take 1 capsule po  QD and 2 capsules po QPM Boscia, Mysti E, NP  Active   propranolol  ER (INDERAL  LA) 80 MG 24 hr capsule [id number] Yes Take 1 capsule (80 mg total) by mouth daily. Chandra Toribio POUR, MD  Active   QUEtiapine  (SEROQUEL ) 200 MG tablet [id number] Yes Take 1 tablet (200 mg total) by mouth at bedtime. Boscia, Anais E, NP  Active   rizatriptan  (MAXALT ) 10 MG tablet [id number] Yes TAKE 1 TABLET PO ONCE AT ONSET OF MIGRAINE HEADACHE. MAY REPEAT DOSE IN 2 HOURS IF NEEDED FOR PERSISTENT SYMPTOMS.  Boscia, Jourdan E, NP  Active   semaglutide -weight management (WEGOVY ) 1.7 MG/0.75ML SOAJ SQ injection [id number] Yes INJECT 1.7 MG INTO THE SKIN ONCE A WEEK. Chandra Toribio POUR, MD  Active   sertraline  (ZOLOFT ) 100 MG tablet [id number] Yes Take 150 mg by mouth daily. [provider]  Active   traMADol  (ULTRAM ) 50 MG tablet [id number] Yes TAKE 1 TABLET BY MOUTH THREE TIMES A DAY AS NEEDED Chandra Toribio POUR, MD  Active   VITAMIN D , ERGOCALCIFEROL , PO [id number] Yes Take by mouth. TAKE ONE TABLET TWICE PER WEEK [provider]  Active             Home Care and Equipment/Supplies: Were Home Health Services Ordered?: NA Any new equipment or medical supplies ordered?: NA  Functional Questionnaire: Do you need assistance with bathing/showering or dressing?: No Do you need assistance with meal preparation?: No Do you need assistance with eating?: No Do you have difficulty maintaining continence: No Do you need assistance with getting out of bed/getting out of a chair/moving?: No Do you have difficulty managing or taking your medications?: No  Follow up appointments reviewed: PCP Follow-up appointment confirmed?: Yes Date of PCP follow-up appointment?: 07/06/24 Follow-up Provider: [facility] Follow-up appointment confirmed?: No Reason Specialist Follow-Up Not Confirmed: Patient has Specialist Provider Number and will Call for Appointment Do you need transportation to your follow-up appointment?: No Do you understand care options if your condition(s) worsen?: Yes-patient verbalized understanding    SIGNATURE Julian Lemmings, LPN [facility] Nurse Health Advisor Direct Dial [phone]  "

## 2024-07-06 ENCOUNTER — Ambulatory Visit (INDEPENDENT_AMBULATORY_CARE_PROVIDER_SITE_OTHER): Admitting: Family Medicine

## 2024-07-06 ENCOUNTER — Encounter: Payer: Self-pay | Admitting: Family Medicine

## 2024-07-06 VITALS — BP 113/75 | HR 77 | Ht 60.0 in | Wt 137.0 lb

## 2024-07-06 DIAGNOSIS — I10 Essential (primary) hypertension: Secondary | ICD-10-CM

## 2024-07-06 DIAGNOSIS — R7303 Prediabetes: Secondary | ICD-10-CM | POA: Diagnosis not present

## 2024-07-06 DIAGNOSIS — J4520 Mild intermittent asthma, uncomplicated: Secondary | ICD-10-CM | POA: Diagnosis not present

## 2024-07-06 DIAGNOSIS — K760 Fatty (change of) liver, not elsewhere classified: Secondary | ICD-10-CM | POA: Diagnosis not present

## 2024-07-06 DIAGNOSIS — E782 Mixed hyperlipidemia: Secondary | ICD-10-CM

## 2024-07-06 DIAGNOSIS — G43009 Migraine without aura, not intractable, without status migrainosus: Secondary | ICD-10-CM

## 2024-07-06 MED ORDER — NURTEC 75 MG PO TBDP: 1.0000 | ORAL_TABLET | Freq: Every day | ORAL | 2 refills | Status: AC | PRN

## 2024-07-06 NOTE — Patient Instructions (Signed)
" °  VISIT SUMMARY: Today we discussed your recent stroke-like episode and your history of migraines. You were treated with alteplase for the stroke-like symptoms and have been prescribed Nurtec for migraine management.  YOUR PLAN: MIGRAINE WITH AURA: You have a history of migraines with aura. 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 -You have been prescribed Nurtec for both preventing and treating migraines. -Use the QR code provided to get a discount on Nurtec.      "

## 2024-07-06 NOTE — Progress Notes (Signed)
" ° ° °  Subjective   Patient ID: [name] [name], female    DOB: [birth year]  Age: 64 y.o. MRN: [id number]  Chief Complaint  Patient presents with   Hospitalization Follow-up     History of Present Illness   [name] [name] is a 64 year old female who presents with stroke-like symptoms.  At work she had acute slurred speech, left-sided facial droop, and left leg tingling described as heaviness. She was admitted, had a CT scan, and received TPA with gradual improvement by discharge. After TPA she had a transient headache and blurry vision, which resolved. She was discharged on June 28, 2024 and has had no recurrent neurologic symptoms since.  She has migraines treated with rizatriptan , which has controlled two recent weather-related headaches. She has not had migraines with similar focal neurologic symptoms before. She is tolerating propranolol  80 mg without issues.          The 10-year ASCVD risk score (Arnett DK, et al., 2019) is: 4.1%  Health Maintenance Due  Topic Date Due   COVID-19 Vaccine (1) Never done   HIV Screening  Never done   Mammogram  07/19/2021   Zoster Vaccines- Shingrix (2 of 2) 10/26/2023      Objective:     BP 113/75   Pulse 77   Ht 5' (1.524 m)   Wt 137 lb (62.1 kg)   LMP 10/23/2011   SpO2 96%   BMI 26.76 kg/m    Physical Exam   Gen: alert, oriented Cv: rrr Pulm: no respiratory distress NEUROLOGICAL: Cranial nerves II-XII grossly intact. Motor strength 5/5 in all extremities.        No results found for any visits on 07/06/24.      Assessment & Plan:   Migraine without aura and without status migrainosus, not intractable Assessment & Plan: Stroke-like episode treated with alteplase. Symptoms improved. 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 Nurtec prescribed as alternative, effective for CGRP receptors. - Prescribed Nurtec for migraine management, preventative and abortive use. - Provided QR code for Nurtec discount  card. - Continue follow-up with neurology on June 5th.    Other orders -     Nurtec; Take 1 tablet (75 mg total) by mouth daily as needed.  Dispense: 8 tablet; Refill: 2       No follow-ups on file.    Toribio MARLA Slain, MD  "

## 2024-07-06 NOTE — Assessment & Plan Note (Addendum)
 Stroke-like episode treated with alteplase. Symptoms improved. 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 Nurtec prescribed as alternative, effective for CGRP receptors. - Prescribed Nurtec for migraine management, preventative and abortive use. - Provided QR code for Nurtec discount card. - Continue follow-up with neurology on June 5th.

## 2024-07-07 ENCOUNTER — Other Ambulatory Visit: Payer: Self-pay | Admitting: Family Medicine

## 2024-07-07 ENCOUNTER — Telehealth: Payer: Self-pay

## 2024-07-07 ENCOUNTER — Other Ambulatory Visit (HOSPITAL_COMMUNITY): Payer: Self-pay

## 2024-07-07 NOTE — Telephone Encounter (Signed)
 Pharmacy Patient Advocate Encounter   Received notification from [employer] that prior authorization for Nurtec 75MG  dispersible tablets  is required/requested.   Insurance verification completed.   The patient is insured through [facility].   Per test claim: PA required; PA submitted to above mentioned insurance via Latent Key/confirmation #/EOC [id number] Status is pending

## 2024-07-11 ENCOUNTER — Other Ambulatory Visit (HOSPITAL_COMMUNITY): Payer: Self-pay

## 2024-07-11 NOTE — Telephone Encounter (Signed)
 Pharmacy Patient Advocate Encounter  Received notification from [facility] that Prior Authorization for Nurtec 75MG  dispersible tablets has been APPROVED from 07/08/2024 to 07/08/2025   PA #/Case ID/Reference #: [id number]

## 2024-12-20 ENCOUNTER — Other Ambulatory Visit

## 2024-12-27 ENCOUNTER — Encounter: Admitting: Family Medicine
